# Patient Record
Sex: Male | Born: 1955 | Race: White | Hispanic: No | Marital: Married | Smoking: Former smoker
Health system: Southern US, Community
[De-identification: ages and names within clinical notes are randomized; demographics above are authoritative.]

## PROBLEM LIST (undated history)

## (undated) DIAGNOSIS — S3992XA Unspecified injury of lower back, initial encounter: Secondary | ICD-10-CM

## (undated) DIAGNOSIS — U071 COVID-19: Secondary | ICD-10-CM

## (undated) DIAGNOSIS — K219 Gastro-esophageal reflux disease without esophagitis: Secondary | ICD-10-CM

## (undated) HISTORY — PX: GYNECOMASTIA EXCISION: SHX5170

## (undated) HISTORY — DX: COVID-19: U07.1

## (undated) HISTORY — DX: Unspecified injury of lower back, initial encounter: S39.92XA

## (undated) HISTORY — DX: Gastro-esophageal reflux disease without esophagitis: K21.9

---

## 2013-02-09 ENCOUNTER — Encounter: Payer: Self-pay | Admitting: Adult Health

## 2013-02-09 ENCOUNTER — Ambulatory Visit (INDEPENDENT_AMBULATORY_CARE_PROVIDER_SITE_OTHER): Payer: Self-pay | Admitting: Adult Health

## 2013-02-09 VITALS — BP 124/82 | HR 55 | Temp 98.3°F | Resp 14 | Ht 68.75 in | Wt 181.0 lb

## 2013-02-09 DIAGNOSIS — Z0000 Encounter for general adult medical examination without abnormal findings: Secondary | ICD-10-CM

## 2013-02-09 DIAGNOSIS — K219 Gastro-esophageal reflux disease without esophagitis: Secondary | ICD-10-CM | POA: Insufficient documentation

## 2013-02-09 DIAGNOSIS — Z1211 Encounter for screening for malignant neoplasm of colon: Secondary | ICD-10-CM

## 2013-02-09 DIAGNOSIS — Z23 Encounter for immunization: Secondary | ICD-10-CM

## 2013-02-09 MED ORDER — PANTOPRAZOLE SODIUM 40 MG PO TBEC: 40.0000 mg | DELAYED_RELEASE_TABLET | Freq: Every day | ORAL | Status: DC

## 2013-02-09 NOTE — Progress Notes (Signed)
  Subjective:    Patient ID: Carlos Summers, male    DOB: 04-06-1956, 57 y.o.   MRN: [id number]  HPI  Patient is a pleasant 57 y/o male who presents to clinic today to establish care. He reports that he has not seen a medical provider in approximately 6 years. Denies any significant medical hx other than GERD for which he took a PPI for a short period of time. Pt reports that he still has significant symptoms of acid reflux. Patient currently does not take any medications.  Family Hx: Patient is adopted and has no information about his family hx.  Social Hx: Mr. Gibbon lives at home with his wife. He has 2 adult sons. 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 He exercises regularly with free weights and he cycles for his cardio. He has a  20 yrs <1 ppd remote tobacco hx. Patient reports that he quit approximately 5 years ago.   Health Maintenance:  Needs Tdap - we will give this to him today.  Influenza immunization - received October 2013 at his place of employment  Needs screening colonoscopy - Will refer to GI  We will check routine labs - patient is not fasting today so he will return Monday for fasting labs.    Review of Systems  Constitutional: Negative.   HENT: Negative.   Eyes: Negative.   Respiratory: Negative.   Cardiovascular: Negative.   Gastrointestinal:       Acid reflux  Endocrine: Negative.   Genitourinary: Negative.   Musculoskeletal: Negative.   Skin: Negative.   Allergic/Immunologic: Negative.   Neurological: Negative.   Hematological: Negative.   Psychiatric/Behavioral: Negative.     BP 124/82  Pulse 55  Temp(Src) 98.3 F (36.8 C) (Oral)  Resp 14  Ht 5' 8.75" (1.746 m)  Wt 181 lb (82.101 kg)  BMI 26.93 kg/m2  SpO2 97%     Objective:   Physical Exam  Constitutional: He is oriented to person, place, and time. He appears well-developed and well-nourished. No distress.  HENT:  Head: Normocephalic and atraumatic.  Right Ear: External ear  normal.  Left Ear: External ear normal.  Nose: Nose normal.  Mouth/Throat: Oropharynx is clear and moist.  Eyes: Conjunctivae and EOM are normal. Pupils are equal, round, and reactive to light.  Neck: Normal range of motion. Neck supple. No tracheal deviation present.  Cardiovascular: Normal rate, regular rhythm, normal heart sounds and intact distal pulses.  Exam reveals no gallop and no friction rub.   No murmur heard. Pulmonary/Chest: Effort normal and breath sounds normal. No respiratory distress. He has no wheezes. He has no rales.  Abdominal: Soft. Bowel sounds are normal. He exhibits no mass. There is no tenderness. There is no guarding.  Musculoskeletal: Normal range of motion. He exhibits no edema and no tenderness.  Lymphadenopathy:    He has no cervical adenopathy.  Neurological: He is alert and oriented to person, place, and time. He has normal reflexes. No cranial nerve deficit. He exhibits normal muscle tone. Coordination normal.  Skin: Skin is warm and dry.  Psychiatric: He has a normal mood and affect. His behavior is normal. Judgment and thought content normal.       Assessment & Plan:

## 2013-02-09 NOTE — Assessment & Plan Note (Addendum)
Normal physical exam. Will refer for screening colonoscopy. Check labs: cbc w/diff, cmet, lipids, PSA.  Tdap administered today.

## 2013-02-09 NOTE — Patient Instructions (Addendum)
  Thank you for choosing [facility] for your health care needs.  We will give you your tetanus vaccine today. This will be good for 10 years.  I am referring you to a GI specialist for a screening colonoscopy. Our office will contact you with that information.  Please return for your fasting Labs. Make an appointment just for labs prior to leaving the office today.  Once I receive the results, they will be made available through MyChart. Please remember to activate your account. Below is information on how to do this.  I have sent in a prescription for Protonix to your pharmacy. Take this medication daily - first thing in the morning and 30 min prior to eating or drinking anything.

## 2013-02-09 NOTE — Assessment & Plan Note (Signed)
Symptoms bothersome to patient. Will start protonix daily.

## 2013-02-11 ENCOUNTER — Encounter: Payer: Self-pay | Admitting: Adult Health

## 2013-02-12 ENCOUNTER — Other Ambulatory Visit (INDEPENDENT_AMBULATORY_CARE_PROVIDER_SITE_OTHER): Payer: PRIVATE HEALTH INSURANCE

## 2013-02-12 DIAGNOSIS — Z0000 Encounter for general adult medical examination without abnormal findings: Secondary | ICD-10-CM

## 2013-02-12 LAB — PSA: PSA: 0.15 ng/mL (ref 0.10–4.00)

## 2013-02-12 LAB — CBC WITH DIFFERENTIAL/PLATELET
Basophils Absolute: 0 10*3/uL (ref 0.0–0.1)
Basophils Relative: 0.8 % (ref 0.0–3.0)
Eosinophils Absolute: 0.2 10*3/uL (ref 0.0–0.7)
Lymphocytes Relative: 20.7 % (ref 12.0–46.0)
MCHC: 33.4 g/dL (ref 30.0–36.0)
MCV: 89.8 fl (ref 78.0–100.0)
Monocytes Absolute: 0.8 10*3/uL (ref 0.1–1.0)
Neutro Abs: 4.1 10*3/uL (ref 1.4–7.7)
Neutrophils Relative %: 63.1 % (ref 43.0–77.0)
RDW: 12.7 % (ref 11.5–14.6)

## 2013-02-12 LAB — COMPREHENSIVE METABOLIC PANEL
ALT: 27 U/L (ref 0–53)
AST: 22 U/L (ref 0–37)
Alkaline Phosphatase: 38 U/L — ABNORMAL LOW (ref 39–117)
BUN: 12 mg/dL (ref 6–23)
Calcium: 9.3 mg/dL (ref 8.4–10.5)
Chloride: 102 mEq/L (ref 96–112)
Creatinine, Ser: 0.9 mg/dL (ref 0.4–1.5)
Potassium: 4.5 mEq/L (ref 3.5–5.1)

## 2013-02-12 LAB — LIPID PANEL
HDL: 62.1 mg/dL (ref >39.00)
LDL Cholesterol: 100 mg/dL — ABNORMAL HIGH (ref 0–99)
Total CHOL/HDL Ratio: 3
Triglycerides: 63 mg/dL (ref 0.0–149.0)

## 2013-06-05 ENCOUNTER — Encounter: Payer: Self-pay | Admitting: Adult Health

## 2013-06-06 MED ORDER — PANTOPRAZOLE SODIUM 40 MG PO TBEC: 40.0000 mg | DELAYED_RELEASE_TABLET | Freq: Every day | ORAL | Status: DC

## 2013-10-25 ENCOUNTER — Other Ambulatory Visit: Payer: Self-pay

## 2014-06-28 ENCOUNTER — Telehealth: Payer: Self-pay

## 2014-06-28 ENCOUNTER — Other Ambulatory Visit: Payer: Self-pay

## 2014-06-28 MED ORDER — PANTOPRAZOLE SODIUM 40 MG PO TBEC: 40.0000 mg | DELAYED_RELEASE_TABLET | Freq: Every day | ORAL | Status: DC

## 2014-06-28 NOTE — Telephone Encounter (Signed)
Received a request to refill pantoprazole sodium 40mg  tab from pharmacy. Patient last office was 02/09/13. Is it ok to refill?

## 2014-06-28 NOTE — Telephone Encounter (Signed)
yes

## 2014-06-28 NOTE — Telephone Encounter (Signed)
Refill sent to pharmacy.   

## 2015-12-21 HISTORY — PX: BACK SURGERY: SHX140

## 2017-03-30 ENCOUNTER — Encounter: Payer: Self-pay | Admitting: Family Medicine

## 2017-03-30 ENCOUNTER — Ambulatory Visit (INDEPENDENT_AMBULATORY_CARE_PROVIDER_SITE_OTHER): Payer: PRIVATE HEALTH INSURANCE | Admitting: Family Medicine

## 2017-03-30 VITALS — BP 126/82 | HR 67 | Temp 98.2°F | Wt 190.5 lb

## 2017-03-30 DIAGNOSIS — W57XXXA Bitten or stung by nonvenomous insect and other nonvenomous arthropods, initial encounter: Secondary | ICD-10-CM | POA: Insufficient documentation

## 2017-03-30 DIAGNOSIS — Z130 Encounter for screening for diseases of the blood and blood-forming organs and certain disorders involving the immune mechanism: Secondary | ICD-10-CM

## 2017-03-30 DIAGNOSIS — E663 Overweight: Secondary | ICD-10-CM | POA: Diagnosis not present

## 2017-03-30 LAB — COMPREHENSIVE METABOLIC PANEL
ALT: 19 U/L (ref 0–53)
AST: 17 U/L (ref 0–37)
Albumin: 4.6 g/dL (ref 3.5–5.2)
Alkaline Phosphatase: 46 U/L (ref 39–117)
BILIRUBIN TOTAL: 0.7 mg/dL (ref 0.2–1.2)
BUN: 13 mg/dL (ref 6–23)
CHLORIDE: 102 meq/L (ref 96–112)
CO2: 31 meq/L (ref 19–32)
CREATININE: 0.9 mg/dL (ref 0.40–1.50)
Calcium: 9.5 mg/dL (ref 8.4–10.5)
GFR: 91.94 mL/min (ref >60.00)
GLUCOSE: 88 mg/dL (ref 70–99)
Potassium: 4.4 mEq/L (ref 3.5–5.1)
SODIUM: 139 meq/L (ref 135–145)
Total Protein: 6.9 g/dL (ref 6.0–8.3)

## 2017-03-30 LAB — LIPID PANEL
CHOL/HDL RATIO: 4
Cholesterol: 213 mg/dL — ABNORMAL HIGH (ref 0–200)
HDL: 56.5 mg/dL (ref >39.00)
LDL Cholesterol: 135 mg/dL — ABNORMAL HIGH (ref 0–99)
NONHDL: 156.3
TRIGLYCERIDES: 105 mg/dL (ref 0.0–149.0)
VLDL: 21 mg/dL (ref 0.0–40.0)

## 2017-03-30 LAB — CBC
HCT: 40.9 % (ref 39.0–52.0)
HEMOGLOBIN: 13.7 g/dL (ref 13.0–17.0)
MCHC: 33.6 g/dL (ref 30.0–36.0)
MCV: 87.5 fl (ref 78.0–100.0)
Platelets: 226 10*3/uL (ref 150.0–400.0)
RBC: 4.67 Mil/uL (ref 4.22–5.81)
RDW: 13.3 % (ref 11.5–15.5)
WBC: 5.8 10*3/uL (ref 4.0–10.5)

## 2017-03-30 LAB — HEMOGLOBIN A1C: HEMOGLOBIN A1C: 5.7 % (ref 4.6–6.5)

## 2017-03-30 NOTE — Assessment & Plan Note (Signed)
New problem. Unremarkable exam. History unremarkable as well. Patient very concerned. Patient desired testing regarding Lyme disease. Labs today.

## 2017-03-30 NOTE — Progress Notes (Signed)
   Subjective:  Patient ID: Carlos Summers, male    DOB: 1956-04-01  Age: 61 y.o. MRN: [id number]  CC: Tick bite  HPI [name] is a 61 y.o. male presents to the clinic today with complaints of a recent tick bite.  Patient states in the middle of March she was bitten by a tick. He states it was on for approximately 36 hours and then was removed. He was bitten on his right lower extremity.  Patient has a friend who has had Lyme disease. He has no current fevers, chills, or other systemic signs or symptoms. He had no discrete rash following the tick bite. He states that he's had some superficial skin discomfort over his trunk and abdomen recently but no rash noted. He has no other associated symptoms. No other complaints or concerns at this time.   PMH, Surgical Hx, Family Hx, Social History reviewed and updated as below.  Past Medical History:  Diagnosis Date  . GERD (gastroesophageal reflux disease)   . Lower back injury    Ruptured L5   Past Surgical History:  Procedure Laterality Date  . BACK SURGERY  2017   fusion of L2-4   Family History  Problem Relation Age of Onset  . Adopted: Yes   Social History  Substance Use Topics  . Smoking status: Former Smoker    Quit date: 02/10/2008  . Smokeless tobacco: Never Used  . Alcohol use 4.8 oz/week    2 Glasses of wine, 6 Cans of beer per week   Review of Systems  Skin:       Tick bite.   All other systems reviewed and are negative.  Objective:   Today's Vitals: BP 126/82   Pulse 67   Temp 98.2 F (36.8 C) (Oral)   Wt 190 lb 8 oz (86.4 kg)   SpO2 91%   BMI 28.34 kg/m   Physical Exam  Constitutional: He is oriented to person, place, and time. He appears well-developed. No distress.  HENT:  Head: Normocephalic and atraumatic.  Mouth/Throat: Oropharynx is clear and moist.  Eyes: Conjunctivae are normal.  Neck: Normal range of motion.  Cardiovascular: Normal rate and regular rhythm.   Pulmonary/Chest: Effort  normal and breath sounds normal.  Abdominal: Soft. He exhibits no distension. There is no tenderness.  Musculoskeletal: Normal range of motion.  Neurological: He is alert and oriented to person, place, and time.  Skin: No rash noted.  Psychiatric: He has a normal mood and affect.  Vitals reviewed.  Assessment & Plan:   Problem List Items Addressed This Visit    Tick bite - Primary    New problem. Unremarkable exam. History unremarkable as well. Patient very concerned. Patient desired testing regarding Lyme disease. Labs today.      Relevant Orders   Lyme Ab/Western Blot Reflex    Other Visit Diagnoses    Screening for deficiency anemia       Relevant Orders   CBC   Overweight (BMI 25.0-29.9)       Relevant Orders   Hemoglobin A1c   Comprehensive metabolic panel   Lipid panel     Follow-up: Has physical in May  Everlene Other DO [facility]

## 2017-03-30 NOTE — Patient Instructions (Signed)
We will call with your lab results.  Follow up in may.  Take care  Dr. Adriana Simas

## 2017-03-30 NOTE — Progress Notes (Signed)
Pre visit review using our clinic review tool, if applicable. No additional management support is needed unless otherwise documented below in the visit note. 

## 2017-03-31 LAB — LYME AB/WESTERN BLOT REFLEX: B burgdorferi Ab IgG+IgM: <0.9 Index (ref <0.90)

## 2017-04-21 ENCOUNTER — Ambulatory Visit (INDEPENDENT_AMBULATORY_CARE_PROVIDER_SITE_OTHER): Payer: PRIVATE HEALTH INSURANCE | Admitting: Family Medicine

## 2017-04-21 ENCOUNTER — Encounter: Payer: Self-pay | Admitting: Family Medicine

## 2017-04-21 ENCOUNTER — Ambulatory Visit: Payer: PRIVATE HEALTH INSURANCE | Admitting: Family Medicine

## 2017-04-21 DIAGNOSIS — Z0000 Encounter for general adult medical examination without abnormal findings: Secondary | ICD-10-CM | POA: Diagnosis not present

## 2017-04-21 DIAGNOSIS — E785 Hyperlipidemia, unspecified: Secondary | ICD-10-CM | POA: Insufficient documentation

## 2017-04-21 DIAGNOSIS — R7303 Prediabetes: Secondary | ICD-10-CM | POA: Insufficient documentation

## 2017-04-21 NOTE — Patient Instructions (Signed)
 Health Maintenance, Male A healthy lifestyle and preventive care is important for your health and wellness. Ask your health care provider about what schedule of regular examinations is right for you. What should I know about weight and diet?  Eat a Healthy Diet  Eat plenty of vegetables, fruits, whole grains, low-fat dairy products, and lean protein.  Do not eat a lot of foods high in solid fats, added sugars, or salt. Maintain a Healthy Weight  Regular exercise can help you achieve or maintain a healthy weight. You should:  Do at least 150 minutes of exercise each week. The exercise should increase your heart rate and make you sweat (moderate-intensity exercise).  Do strength-training exercises at least twice a week. Watch Your Levels of Cholesterol and Blood Lipids  Have your blood tested for lipids and cholesterol every 5 years starting at 59 years of age. If you are at high risk for heart disease, you should start having your blood tested when you are 59 years old. You may need to have your cholesterol levels checked more often if:  Your lipid or cholesterol levels are high.  You are older than 59 years of age.  You are at high risk for heart disease. What should I know about cancer screening? Many types of cancers can be detected early and may often be prevented. Lung Cancer  You should be screened every year for lung cancer if:  You are a current smoker who has smoked for at least 30 years.  You are a former smoker who has quit within the past 15 years.  Talk to your health care provider about your screening options, when you should start screening, and how often you should be screened. Colorectal Cancer  Routine colorectal cancer screening usually begins at 59 years of age and should be repeated every 5-10 years until you are 59 years old. You may need to be screened more often if early forms of precancerous polyps or small growths are found. Your health care provider  may recommend screening at an earlier age if you have risk factors for colon cancer.  Your health care provider may recommend using home test kits to check for hidden blood in the stool.  A small camera at the end of a tube can be used to examine your colon (sigmoidoscopy or colonoscopy). This checks for the earliest forms of colorectal cancer. Prostate and Testicular Cancer  Depending on your age and overall health, your health care provider may do certain tests to screen for prostate and testicular cancer.  Talk to your health care provider about any symptoms or concerns you have about testicular or prostate cancer. Skin Cancer  Check your skin from head to toe regularly.  Tell your health care provider about any new moles or changes in moles, especially if:  There is a change in a mole's size, shape, or color.  You have a mole that is larger than a pencil eraser.  Always use sunscreen. Apply sunscreen liberally and repeat throughout the day.  Protect yourself by wearing long sleeves, pants, a wide-brimmed hat, and sunglasses when outside. What should I know about heart disease, diabetes, and high blood pressure?  If you are 18-39 years of age, have your blood pressure checked every 3-5 years. If you are 40 years of age or older, have your blood pressure checked every year. You should have your blood pressure measured twice-once when you are at a hospital or clinic, and once when you are not at   a hospital or clinic. Record the average of the two measurements. To check your blood pressure when you are not at a hospital or clinic, you can use:  An automated blood pressure machine at a pharmacy.  A home blood pressure monitor.  Talk to your health care provider about your target blood pressure.  If you are between 45-79 years old, ask your health care provider if you should take aspirin to prevent heart disease.  Have regular diabetes screenings by checking your fasting blood sugar  level.  If you are at a normal weight and have a low risk for diabetes, have this test once every three years after the age of 45.  If you are overweight and have a high risk for diabetes, consider being tested at a younger age or more often.  A one-time screening for abdominal aortic aneurysm (AAA) by ultrasound is recommended for men aged 65-75 years who are current or former smokers. What should I know about preventing infection? Hepatitis B  If you have a higher risk for hepatitis B, you should be screened for this virus. Talk with your health care provider to find out if you are at risk for hepatitis B infection. Hepatitis C  Blood testing is recommended for:  Everyone born from 1945 through 1965.  Anyone with known risk factors for hepatitis C. Sexually Transmitted Diseases (STDs)  You should be screened each year for STDs including gonorrhea and chlamydia if:  You are sexually active and are younger than 59 years of age.  You are older than 59 years of age and your health care provider tells you that you are at risk for this type of infection.  Your sexual activity has changed since you were last screened and you are at an increased risk for chlamydia or gonorrhea. Ask your health care provider if you are at risk.  Talk with your health care provider about whether you are at high risk of being infected with HIV. Your health care provider may recommend a prescription medicine to help prevent HIV infection. What else can I do?  Schedule regular health, dental, and eye exams.  Stay current with your vaccines (immunizations).  Do not use any tobacco products, such as cigarettes, chewing tobacco, and e-cigarettes. If you need help quitting, ask your health care provider.  Limit alcohol intake to no more than 2 drinks per day. One drink equals 12 ounces of beer, 5 ounces of wine, or 1 ounces of hard liquor.  Do not use street drugs.  Do not share needles.  Ask your health  care provider for help if you need support or information about quitting drugs.  Tell your health care provider if you often feel depressed.  Tell your health care provider if you have ever been abused or do not feel safe at home. This information is not intended to replace advice given to you by your health care provider. Make sure you discuss any questions you have with your health care provider. Document Released: 06/03/2008 Document Revised: 08/04/2016 Document Reviewed: 09/09/2015 Elsevier Interactive Patient Education  2017 Elsevier Inc.  

## 2017-04-21 NOTE — Progress Notes (Signed)
Pre visit review using our clinic review tool, if applicable. No additional management support is needed unless otherwise documented below in the visit note. 

## 2017-04-21 NOTE — Assessment & Plan Note (Signed)
Colonoscopy up-to-date. Tetanus and flu up-to-date. Declined hep C and HIV screening.

## 2017-04-21 NOTE — Progress Notes (Signed)
   Subjective:  Patient ID: Carlos CriglerMichael A Ricchio, male    DOB: 08-02-1956  Age: 61 y.o. MRN: [id number]  CC: Annual physical  HPI Carlos Summers is a 61 y.o. male presents to the clinic today for an annual physical exam.   Preventative Healthcare  Colonoscopy: Up to date.  Immunizations  Tetanus - Up to date.   Pneumococcal - Not indicated.   Flu - Up to date.   Hepatitis C screening - Declines.  Labs: Has had recent labs.   Alcohol use: See below.  Smoking/tobacco use: Former.   STD/HIV testing: Declines.  PMH, Surgical Hx, Family Hx, Social History reviewed and updated as below.  Past Medical History:  Diagnosis Date  . GERD (gastroesophageal reflux disease)   . Lower back injury    Ruptured L5   Past Surgical History:  Procedure Laterality Date  . BACK SURGERY  2017   fusion of L2-4   Family History  Problem Relation Age of Onset  . Adopted: Yes   Social History  Substance Use Topics  . Smoking status: Former Smoker    Quit date: 02/10/2008  . Smokeless tobacco: Never Used  . Alcohol use 4.8 oz/week    2 Glasses of wine, 6 Cans of beer per week   Review of Systems General: Denies unexplained weight loss, fever. Skin: Denies new or changing mole, sore/wound that won't heal. ENT: Trouble hearing, ringing in the ears, sores in the mouth, hoarseness, trouble swallowing. Eyes: Denies trouble seeing/visual disturbance. Heart/CV: Denies chest pain, shortness of breath, edema, palpitations. Lungs/Resp: Denies cough, shortness of breath, hemoptysis. Abd/GI: Denies nausea, vomiting, diarrhea, constipation, abdominal pain, hematochezia, melena. GU: Denies dysuria, incontinence, hematuria, urinary frequency, difficulty starting/keeping stream, penile discharge, sexual difficulty, lump in testicles. MSK: Denies joint pain/swelling, myalgias. Neuro: Denies headaches, weakness, numbness, dizziness, syncope. Psych: Denies sadness, anxiety, stress, memory  difficulty. Endocrine: Denies polyuria and polydipsia.  Objective:   Today's Vitals: BP 130/90   Pulse 63   Temp 98.4 F (36.9 C) (Oral)   Wt 189 lb 2 oz (85.8 kg)   SpO2 96%   BMI 28.13 kg/m   Physical Exam  Constitutional: He is oriented to person, place, and time. He appears well-developed and well-nourished. No distress.  HENT:  Head: Normocephalic and atraumatic.  Nose: Nose normal.  Mouth/Throat: Oropharynx is clear and moist. No oropharyngeal exudate.  Normal TM's bilaterally.   Eyes: Conjunctivae are normal. No scleral icterus.  Neck: Neck supple.  Cardiovascular: Normal rate and regular rhythm.   No murmur heard. Pulmonary/Chest: Effort normal and breath sounds normal. He has no wheezes. He has no rales.  Abdominal: Soft. He exhibits no distension. There is no tenderness. There is no rebound and no guarding.  Musculoskeletal: Normal range of motion. He exhibits no edema.  Lymphadenopathy:    He has no cervical adenopathy.  Neurological: He is alert and oriented to person, place, and time.  Skin: Skin is warm and dry. No rash noted.  Psychiatric: He has a normal mood and affect.  Vitals reviewed.  Assessment & Plan:   Problem List Items Addressed This Visit    Annual physical exam    Colonoscopy up-to-date. Tetanus and flu up-to-date. Declined hep C and HIV screening.         Follow-up: Return in about 1 year (around 04/21/2018).  Everlene OtherJayce Notnamed Scholz DO [facility] [place]

## 2018-01-06 ENCOUNTER — Encounter: Payer: Self-pay | Admitting: Internal Medicine

## 2018-01-06 ENCOUNTER — Ambulatory Visit: Payer: PRIVATE HEALTH INSURANCE | Admitting: Internal Medicine

## 2018-01-06 VITALS — BP 140/82 | HR 65 | Temp 99.1°F | Ht 70.0 in | Wt 196.5 lb

## 2018-01-06 DIAGNOSIS — J069 Acute upper respiratory infection, unspecified: Secondary | ICD-10-CM | POA: Diagnosis not present

## 2018-01-06 MED ORDER — AZITHROMYCIN 250 MG PO TABS: ORAL_TABLET | ORAL | 0 refills | Status: DC

## 2018-01-06 NOTE — Progress Notes (Signed)
Chief Complaint  Patient presents with  . Cough   C/o chest congestion and dry cough since before Xmas no fever, chills, body aches, sore throat slight runny nose no sneezing. 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 Sick contacts at work. Tried Mucinex OTC. Former light smoker quit 2011/2012.  Had flu shot 08/2017    Review of Systems  Constitutional: Negative for fever.  HENT: Negative for sore throat.        +postnasal drip  +runny nose  +hoarseness   Respiratory: Positive for cough. Negative for shortness of breath and wheezing.   Cardiovascular: Negative for chest pain.  Skin: Negative for rash.   Past Medical History:  Diagnosis Date  . GERD (gastroesophageal reflux disease)   . Lower back injury    Ruptured L5   Past Surgical History:  Procedure Laterality Date  . BACK SURGERY  2017   fusion of L2-4   Family History  Adopted: Yes   Social History   Socioeconomic History  . Marital status: Married    Spouse name: Not on file  . Number of children: Not on file  . Years of education: Not on file  . Highest education level: Not on file  Social Needs  . Financial resource strain: Not on file  . Food insecurity - worry: Not on file  . Food insecurity - inability: Not on file  . Transportation needs - medical: Not on file  . Transportation needs - non-medical: Not on file  Occupational History  . Occupation: MudloggerMachinest    Employer: [employer]  Tobacco Use  . Smoking status: Former Smoker    Last attempt to quit: 02/10/2008    Years since quitting: 9.9  . Smokeless tobacco: Never Used  . Tobacco comment: quit 2011/2012 light smoker  Substance and Sexual Activity  . Alcohol use: Yes    Alcohol/week: 4.8 oz    Types: 2 Glasses of wine, 6 Cans of beer per week  . Drug use: No  . Sexual activity: Yes    Partners: Female  Other Topics Concern  . Not on file  Social History Narrative   Musician    No outpatient medications have been  marked as taking for the 01/06/18 encounter (Office Visit) with McLean-Scocuzza, Pasty Spillersracy N, MD.   No Known Allergies No results found for this or any previous visit (from the past 2160 hour(s)). Objective  Body mass index is 28.19 kg/m. Wt Readings from Last 3 Encounters:  01/06/18 196 lb 8 oz (89.1 kg)  04/21/17 189 lb 2 oz (85.8 kg)  03/30/17 190 lb 8 oz (86.4 kg)   Temp Readings from Last 3 Encounters:  01/06/18 99.1 F (37.3 C) (Oral)  04/21/17 98.4 F (36.9 C) (Oral)  03/30/17 98.2 F (36.8 C) (Oral)   BP Readings from Last 3 Encounters:  01/06/18 140/82  04/21/17 130/90  03/30/17 126/82   Pulse Readings from Last 3 Encounters:  01/06/18 65  04/21/17 63  03/30/17 67   O2 sat room air 95%  Physical Exam  Constitutional: He is oriented to person, place, and time and well-developed, well-nourished, and in no distress.  HENT:  Head: Normocephalic and atraumatic.  Eyes: Conjunctivae are normal. Pupils are equal, round, and reactive to light.  Cardiovascular: Normal rate, regular rhythm and normal heart sounds.  Pulmonary/Chest: Effort normal and breath sounds normal.  Neurological: He is alert and oriented to person, place, and time. Gait normal.  Skin: Skin is warm  and dry.  Psychiatric: Mood, memory, affect and judgment normal.  Nursing note and vitals reviewed.   Assessment   1. URI could be viral vs bacterial x 3 weeks  Plan  1. Zpack  Call in 2 weeks if not better will do cXR  Congratulated on quiting smoking 7-8 years ago  Supportive care otherwise Had flu shot 08/2017  Provider: Dr. French Ana McLean-Scocuzza-[facility]

## 2018-01-06 NOTE — Patient Instructions (Addendum)
Call back in 2 weeks if not better  Otherwise physical middle  03/2018  Upper Respiratory Infection, Adult Most upper respiratory infections (URIs) are caused by a virus. A URI affects the nose, throat, and upper air passages. The most common type of URI is often called "the common cold." Follow these instructions at home:  Take medicines only as told by your doctor.  Gargle warm saltwater or take cough drops to comfort your throat as told by your doctor.  Use a warm mist humidifier or inhale steam from a shower to increase air moisture. This may make it easier to breathe.  Drink enough fluid to keep your pee (urine) clear or pale yellow.  Eat soups and other clear broths.  Have a healthy diet.  Rest as needed.  Go back to work when your fever is gone or your doctor says it is okay. ? You may need to stay home longer to avoid giving your URI to others. ? You can also wear a face mask and wash your hands often to prevent spread of the virus.  Use your inhaler more if you have asthma.  Do not use any tobacco products, including cigarettes, chewing tobacco, or electronic cigarettes. If you need help quitting, ask your doctor. Contact a doctor if:  You are getting worse, not better.  Your symptoms are not helped by medicine.  You have chills.  You are getting more short of breath.  You have brown or red mucus.  You have yellow or brown discharge from your nose.  You have pain in your face, especially when you bend forward.  You have a fever.  You have puffy (swollen) neck glands.  You have pain while swallowing.  You have white areas in the back of your throat. Get help right away if:  You have very bad or constant: ? Headache. ? Ear pain. ? Pain in your forehead, behind your eyes, and over your cheekbones (sinus pain). ? Chest pain.  You have long-lasting (chronic) lung disease and any of the following: ? Wheezing. ? Long-lasting cough. ? Coughing up  blood. ? A change in your usual mucus.  You have a stiff neck.  You have changes in your: ? Vision. ? Hearing. ? Thinking. ? Mood. This information is not intended to replace advice given to you by your health care provider. Make sure you discuss any questions you have with your health care provider. Document Released: 05/24/2008 Document Revised: 08/08/2016 Document Reviewed: 03/13/2014 Elsevier Interactive Patient Education  2018 [employer]

## 2018-01-19 ENCOUNTER — Telehealth: Payer: Self-pay

## 2018-01-19 NOTE — Telephone Encounter (Signed)
Copied from CRM [id number]. Topic: Appointment Scheduling - Scheduling Inquiry for Clinic >> Jan 19, 2018  3:52 PM Eston Mouldavis, Cheri B wrote: Reason for CRM: PT called to schedule an apt with Dr Shirlee LatchMcLean  because he saw her two weeks ago for a cold and was told to come back in 2 weeks if he was not better.  When looking at schedule I did not see an available apt slot for tomorrow. I told pt I would send over a note to the office to see if he can be worked in [employer] account executivetomorrow-

## 2018-01-20 NOTE — Telephone Encounter (Signed)
Patient will have to be seen by another provider.

## 2018-01-20 NOTE — Telephone Encounter (Signed)
Spoke to patient feels like he has chest cold.  Still coughing in the am, productive cough green to yellowish in color ,   dry cough in pm , Reports he is  Better than when  he was seen.  Still taking Mucinex DM  in the am, drinking plenty of water. No fever or chills.   No shortness of breath.  Scheduled for appointment on Monday

## 2018-01-23 ENCOUNTER — Ambulatory Visit (INDEPENDENT_AMBULATORY_CARE_PROVIDER_SITE_OTHER): Payer: PRIVATE HEALTH INSURANCE

## 2018-01-23 ENCOUNTER — Ambulatory Visit: Payer: PRIVATE HEALTH INSURANCE | Admitting: Internal Medicine

## 2018-01-23 ENCOUNTER — Encounter: Payer: Self-pay | Admitting: Internal Medicine

## 2018-01-23 VITALS — BP 124/68 | HR 65 | Temp 97.9°F | Ht 70.0 in | Wt 199.6 lb

## 2018-01-23 DIAGNOSIS — J309 Allergic rhinitis, unspecified: Secondary | ICD-10-CM

## 2018-01-23 DIAGNOSIS — Z125 Encounter for screening for malignant neoplasm of prostate: Secondary | ICD-10-CM

## 2018-01-23 DIAGNOSIS — E785 Hyperlipidemia, unspecified: Secondary | ICD-10-CM

## 2018-01-23 DIAGNOSIS — Z13818 Encounter for screening for other digestive system disorders: Secondary | ICD-10-CM | POA: Diagnosis not present

## 2018-01-23 DIAGNOSIS — K219 Gastro-esophageal reflux disease without esophagitis: Secondary | ICD-10-CM | POA: Diagnosis not present

## 2018-01-23 DIAGNOSIS — R7303 Prediabetes: Secondary | ICD-10-CM

## 2018-01-23 DIAGNOSIS — R05 Cough: Secondary | ICD-10-CM | POA: Diagnosis not present

## 2018-01-23 DIAGNOSIS — Z1159 Encounter for screening for other viral diseases: Secondary | ICD-10-CM

## 2018-01-23 DIAGNOSIS — R490 Dysphonia: Secondary | ICD-10-CM

## 2018-01-23 DIAGNOSIS — Z1329 Encounter for screening for other suspected endocrine disorder: Secondary | ICD-10-CM

## 2018-01-23 DIAGNOSIS — R059 Cough, unspecified: Secondary | ICD-10-CM | POA: Insufficient documentation

## 2018-01-23 MED ORDER — PANTOPRAZOLE SODIUM 40 MG PO TBEC: 40.0000 mg | DELAYED_RELEASE_TABLET | ORAL | 0 refills | Status: DC

## 2018-01-23 MED ORDER — FLUTICASONE PROPIONATE 50 MCG/ACT NA SUSP: 1.0000 | Freq: Every day | NASAL | 5 refills | Status: DC

## 2018-01-23 NOTE — Progress Notes (Signed)
Pre visit review using our clinic review tool, if applicable. No additional management support is needed unless otherwise documented below in the visit note. 

## 2018-01-23 NOTE — Progress Notes (Signed)
Chief Complaint  Patient presents with  . Follow-up   F/u  Still c/o cough and intermittent hoarseness tried Mucinex but still coughing in am sometimes yellow/green phlegm but it is less since he took Zpack, No fever or chills. He does report postnasal drip and GERD. He used to be on Protonix 40 mg and wants to see if this will help with sx's b/c he does have flares of GERD at times with certain foods I.e spicy foods.  He denies h/o asthma or allergies but does work in Insurance claims handlermachine shop with irritants.    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    Review of Systems  Constitutional: Negative for chills and fever.  HENT:       +hoarseness  +post nasal drip  Respiratory: Positive for cough.   Cardiovascular: Negative for chest pain.  Gastrointestinal: Positive for heartburn.  Skin: Negative for rash.   Past Medical History:  Diagnosis Date  . GERD (gastroesophageal reflux disease)   . Lower back injury    Ruptured L5   Past Surgical History:  Procedure Laterality Date  . BACK SURGERY  2017   fusion of L2-4   Family History  Adopted: Yes   Social History   Socioeconomic History  . Marital status: Married    Spouse name: Not on file  . Number of children: Not on file  . Years of education: Not on file  . Highest education level: Not on file  Social Needs  . Financial resource strain: Not on file  . Food insecurity - worry: Not on file  . Food insecurity - inability: Not on file  . Transportation needs - medical: Not on file  . Transportation needs - non-medical: Not on file  Occupational History  . Occupation: MudloggerMachinest    Employer: [employer]  Tobacco Use  . Smoking status: Former Smoker    Last attempt to quit: 02/10/2008    Years since quitting: 9.9  . Smokeless tobacco: Never Used  . Tobacco comment: quit 2011/2012 light smoker  Substance and Sexual Activity  . Alcohol use: Yes   Alcohol/week: 4.8 oz    Types: 2 Glasses of wine, 6 Cans of beer per week  . Drug use: No  . Sexual activity: Yes    Partners: Female  Other Topics Concern  . Not on file  Social History Narrative   Musician    Current Meds  Medication Sig  . guaiFENesin (MUCINEX) 600 MG 12 hr tablet Take by mouth 2 (two) times daily.   Allergies  Allergen Reactions  . Other    No results found for this or any previous visit (from the past 2160 hour(s)). Objective  Body mass index is 28.64 kg/m. Wt Readings from Last 3 Encounters:  01/23/18 199 lb 9.6 oz (90.5 kg)  01/06/18 196 lb 8 oz (89.1 kg)  04/21/17 189 lb 2 oz (85.8 kg)   Temp Readings from Last 3 Encounters:  01/23/18 97.9 F (36.6 C) (Oral)  01/06/18 99.1 F (37.3 C) (Oral)  04/21/17 98.4 F (36.9 C) (Oral)   BP Readings from Last 3 Encounters:  01/23/18 124/68  01/06/18 140/82  04/21/17 130/90   Pulse Readings from Last 3 Encounters:  01/23/18 65  01/06/18 65  04/21/17 63   O2 sat room air 98% Physical Exam  Constitutional: He is oriented to person, place, and time and well-developed, well-nourished, and  in no distress.  HENT:  Head: Normocephalic and atraumatic.  Eyes: Pupils are equal, round, and reactive to light.  Cardiovascular: Normal rate, regular rhythm and normal heart sounds.  Pulmonary/Chest: Effort normal and breath sounds normal.  Neurological: He is alert and oriented to person, place, and time. Gait normal.  Skin: Skin is warm and dry.  Psychiatric: Mood, memory, affect and judgment normal.  Nursing note and vitals reviewed.   Assessment   1. Cough ? Etiology allergic vs GERD vs post viral vs post nasal drip  2. Hoarseness  3.HM Plan  1.  Trial of protonix 40 mg Trial of flonase could consider OTC zyrtec/claritin  CXR today r/o pneumonia vs other  Given info on GERD 2. Consider ENT if not improved at f/u  tx GERD and postnasal drip to see if improved sx's  3.  Had flu shot 08/2017  Tdap  up to date   Check labs CMET, CBC, lipid, TSH, T4, A1C, UA, PSA, hep B/C at f/u  Former smoker x 20 years total started as a kid unknown FH adopted.   Need to do DRE in future    Provider: Dr. French Ana McLean-Scocuzza-Internal Medicine

## 2018-01-23 NOTE — Patient Instructions (Addendum)
Try Protonix  Try Flonase  Labs schedule 03/30/18 and f/u with me 03/31/18  Gastroesophageal Reflux Disease, Adult Normally, food travels down the esophagus and stays in the stomach to be digested. If a person has gastroesophageal reflux disease (GERD), food and stomach acid move back up into the esophagus. When this happens, the esophagus becomes sore and swollen (inflamed). Over time, GERD can make small holes (ulcers) in the lining of the esophagus. Follow these instructions at home: Diet  Follow a diet as told by your doctor. You may need to avoid foods and drinks such as: ? Coffee and tea (with or without caffeine). ? Drinks that contain alcohol. ? Energy drinks and sports drinks. ? Carbonated drinks or sodas. ? Chocolate and cocoa. ? Peppermint and mint flavorings. ? Garlic and onions. ? Horseradish. ? Spicy and acidic foods, such as peppers, chili powder, curry powder, vinegar, hot sauces, and BBQ sauce. ? Citrus fruit juices and citrus fruits, such as oranges, lemons, and limes. ? Tomato-based foods, such as red sauce, chili, salsa, and pizza with red sauce. ? Fried and fatty foods, such as donuts, french fries, potato chips, and high-fat dressings. ? High-fat meats, such as hot dogs, rib eye steak, sausage, ham, and bacon. ? High-fat dairy items, such as whole milk, butter, and cream cheese.  Eat small meals often. Avoid eating large meals.  Avoid drinking large amounts of liquid with your meals.  Avoid eating meals during the 2-3 hours before bedtime.  Avoid lying down right after you eat.  Do not exercise right after you eat. General instructions  Pay attention to any changes in your symptoms.  Take over-the-counter and prescription medicines only as told by your doctor. Do not take aspirin, ibuprofen, or other NSAIDs unless your doctor says it is okay.  Do not use any tobacco products, including cigarettes, chewing tobacco, and e-cigarettes. If you need help  quitting, ask your doctor.  Wear loose clothes. Do not wear anything tight around your waist.  Raise (elevate) the head of your bed about 6 inches (15 cm).  Try to lower your stress. If you need help doing this, ask your doctor.  If you are overweight, lose an amount of weight that is healthy for you. Ask your doctor about a safe weight loss goal.  Keep all follow-up visits as told by your doctor. This is important. Contact a doctor if:  You have new symptoms.  You lose weight and you do not know why it is happening.  You have trouble swallowing, or it hurts to swallow.  You have wheezing or a cough that keeps happening.  Your symptoms do not get better with treatment.  You have a hoarse voice. Get help right away if:  You have pain in your arms, neck, jaw, teeth, or back.  You feel sweaty, dizzy, or light-headed.  You have chest pain or shortness of breath.  You throw up (vomit) and your throw up looks like blood or coffee grounds.  You pass out (faint).  Your poop (stool) is bloody or black.  You cannot swallow, drink, or eat. This information is not intended to replace advice given to you by your health care provider. Make sure you discuss any questions you have with your health care provider. Document Released: 05/24/2008 Document Revised: 05/13/2016 Document Reviewed: 04/02/2015 Elsevier Interactive Patient Education  2018 [employer]    Food Choices for Gastroesophageal Reflux Disease, Adult When you have gastroesophageal reflux disease (GERD), the foods you eat and  your eating habits are very important. Choosing the right foods can help ease your discomfort. What guidelines do I need to follow?  Choose fruits, vegetables, whole grains, and low-fat dairy products.  Choose low-fat meat, fish, and poultry.  Limit fats such as oils, salad dressings, butter, nuts, and avocado.  Keep a food diary. This helps you identify foods that cause symptoms.  Avoid  foods that cause symptoms. These may be different for everyone.  Eat small meals often instead of 3 large meals a day.  Eat your meals slowly, in a place where you are relaxed.  Limit fried foods.  Cook foods using methods other than frying.  Avoid drinking alcohol.  Avoid drinking large amounts of liquids with your meals.  Avoid bending over or lying down until 2-3 hours after eating. What foods are not recommended? These are some foods and drinks that may make your symptoms worse: Vegetables Tomatoes. Tomato juice. Tomato and spaghetti sauce. Chili peppers. Onion and garlic. Horseradish. Fruits Oranges, grapefruit, and lemon (fruit and juice). Meats High-fat meats, fish, and poultry. This includes hot dogs, ribs, ham, sausage, salami, and bacon. Dairy Whole milk and chocolate milk. Sour cream. Cream. Butter. Ice cream. Cream cheese. Drinks Coffee and tea. Bubbly (carbonated) drinks or energy drinks. Condiments Hot sauce. Barbecue sauce. Sweets/Desserts Chocolate and cocoa. Donuts. Peppermint and spearmint. Fats and Oils High-fat foods. This includes [place] fries and potato chips. Other Vinegar. Strong spices. This includes black pepper, white pepper, red pepper, cayenne, curry powder, cloves, ginger, and chili powder. The items listed above may not be a complete list of foods and drinks to avoid. Contact your dietitian for more information. This information is not intended to replace advice given to you by your health care provider. Make sure you discuss any questions you have with your health care provider. Document Released: 06/06/2012 Document Revised: 05/13/2016 Document Reviewed: 10/10/2013 Elsevier Interactive Patient Education  2017 Elsevier Inc.   Cough, Adult Coughing is a reflex that clears your throat and your airways. Coughing helps to heal and protect your lungs. It is normal to cough occasionally, but a cough that happens with other symptoms or lasts a long  time may be a sign of a condition that needs treatment. A cough may last only 2-3 weeks (acute), or it may last longer than 8 weeks (chronic). What are the causes? Coughing is commonly caused by:  Breathing in substances that irritate your lungs.  A viral or bacterial respiratory infection.  Allergies.  Asthma.  Postnasal drip.  Smoking.  Acid backing up from the stomach into the esophagus (gastroesophageal reflux).  Certain medicines.  Chronic lung problems, including COPD (or rarely, lung cancer).  Other medical conditions such as heart failure.  Follow these instructions at home: Pay attention to any changes in your symptoms. Take these actions to help with your discomfort:  Take medicines only as told by your health care provider. ? If you were prescribed an antibiotic medicine, take it as told by your health care provider. Do not stop taking the antibiotic even if you start to feel better. ? Talk with your health care provider before you take a cough suppressant medicine.  Drink enough fluid to keep your urine clear or pale yellow.  If the air is dry, use a cold steam vaporizer or humidifier in your bedroom or your home to help loosen secretions.  Avoid anything that causes you to cough at work or at home.  If your cough is worse at night,  try sleeping in a semi-upright position.  Avoid cigarette smoke. If you smoke, quit smoking. If you need help quitting, ask your health care provider.  Avoid caffeine.  Avoid alcohol.  Rest as needed.  Contact a health care provider if:  You have new symptoms.  You cough up pus.  Your cough does not get better after 2-3 weeks, or your cough gets worse.  You cannot control your cough with suppressant medicines and you are losing sleep.  You develop pain that is getting worse or pain that is not controlled with pain medicines.  You have a fever.  You have unexplained weight loss.  You have night sweats. Get help  right away if:  You cough up blood.  You have difficulty breathing.  Your heartbeat is very fast. This information is not intended to replace advice given to you by your health care provider. Make sure you discuss any questions you have with your health care provider. Document Released: 06/04/2011 Document Revised: 05/13/2016 Document Reviewed: 02/12/2015 Elsevier Interactive Patient Education  [employer].

## 2018-01-24 ENCOUNTER — Encounter: Payer: Self-pay | Admitting: *Deleted

## 2018-01-24 NOTE — Telephone Encounter (Signed)
Patient would like to try albuteral inhaler. 90 day? [facility] in [place]

## 2018-01-26 ENCOUNTER — Other Ambulatory Visit: Payer: Self-pay | Admitting: Internal Medicine

## 2018-01-26 ENCOUNTER — Telehealth: Payer: Self-pay | Admitting: *Deleted

## 2018-01-26 DIAGNOSIS — J42 Unspecified chronic bronchitis: Secondary | ICD-10-CM

## 2018-01-26 MED ORDER — ALBUTEROL SULFATE HFA 108 (90 BASE) MCG/ACT IN AERS: 1.0000 | INHALATION_SPRAY | Freq: Four times a day (QID) | RESPIRATORY_TRACT | 12 refills | Status: DC | PRN

## 2018-01-26 NOTE — Telephone Encounter (Signed)
Copied from CRM [id number]. Topic: Quick Communication - Other Results >> Jan 26, 2018  1:29 PM Louie BunPalacios Medina, Rosey Batheresa D wrote: Patient called and said that he went to the pharmacy to look for the inhaler but was not there. He would likea call back when it has been sent in to his pharmacy. Please call patient back, thanks.

## 2018-01-26 NOTE — Telephone Encounter (Signed)
Albuterol Rx pending your approval. See mychart message. You mentioned that pt could try this temporarily for his cough. [facility] would [facility] [place].  Thanks

## 2018-01-27 ENCOUNTER — Other Ambulatory Visit: Payer: Self-pay | Admitting: *Deleted

## 2018-01-27 DIAGNOSIS — K219 Gastro-esophageal reflux disease without esophagitis: Secondary | ICD-10-CM

## 2018-01-27 MED ORDER — PANTOPRAZOLE SODIUM 40 MG PO TBEC: 40.0000 mg | DELAYED_RELEASE_TABLET | ORAL | 1 refills | Status: DC

## 2018-01-27 NOTE — Telephone Encounter (Signed)
Pt notified that Rx was sent on 01/26/18 @ 5:25pm.

## 2018-01-27 NOTE — Telephone Encounter (Signed)
[facility]

## 2018-03-30 ENCOUNTER — Other Ambulatory Visit (INDEPENDENT_AMBULATORY_CARE_PROVIDER_SITE_OTHER): Payer: PRIVATE HEALTH INSURANCE

## 2018-03-30 DIAGNOSIS — Z125 Encounter for screening for malignant neoplasm of prostate: Secondary | ICD-10-CM

## 2018-03-30 DIAGNOSIS — Z1159 Encounter for screening for other viral diseases: Secondary | ICD-10-CM

## 2018-03-30 DIAGNOSIS — E785 Hyperlipidemia, unspecified: Secondary | ICD-10-CM | POA: Diagnosis not present

## 2018-03-30 DIAGNOSIS — Z13818 Encounter for screening for other digestive system disorders: Secondary | ICD-10-CM

## 2018-03-30 DIAGNOSIS — R7303 Prediabetes: Secondary | ICD-10-CM

## 2018-03-30 DIAGNOSIS — Z1329 Encounter for screening for other suspected endocrine disorder: Secondary | ICD-10-CM

## 2018-03-30 LAB — URINALYSIS, ROUTINE W REFLEX MICROSCOPIC
Bilirubin Urine: NEGATIVE
HGB URINE DIPSTICK: NEGATIVE
Ketones, ur: NEGATIVE
Leukocytes, UA: NEGATIVE
Nitrite: NEGATIVE
PH: 6 (ref 5.0–8.0)
RBC / HPF: NONE SEEN (ref 0–?)
TOTAL PROTEIN, URINE-UPE24: NEGATIVE
Urine Glucose: NEGATIVE
Urobilinogen, UA: 0.2 (ref 0.0–1.0)
WBC, UA: NONE SEEN (ref 0–?)

## 2018-03-30 LAB — HEMOGLOBIN A1C: Hgb A1c MFr Bld: 5.5 % (ref 4.6–6.5)

## 2018-03-30 LAB — CBC WITH DIFFERENTIAL/PLATELET
BASOS PCT: 1 % (ref 0.0–3.0)
Basophils Absolute: 0.1 10*3/uL (ref 0.0–0.1)
EOS ABS: 0 10*3/uL (ref 0.0–0.7)
Eosinophils Relative: 0.6 % (ref 0.0–5.0)
HCT: 40.1 % (ref 39.0–52.0)
Hemoglobin: 13.7 g/dL (ref 13.0–17.0)
Lymphocytes Relative: 31 % (ref 12.0–46.0)
Lymphs Abs: 1.6 10*3/uL (ref 0.7–4.0)
MCHC: 34.1 g/dL (ref 30.0–36.0)
MCV: 87.1 fl (ref 78.0–100.0)
Monocytes Absolute: 0.6 10*3/uL (ref 0.1–1.0)
Monocytes Relative: 11 % (ref 3.0–12.0)
NEUTROS ABS: 2.9 10*3/uL (ref 1.4–7.7)
Neutrophils Relative %: 56.4 % (ref 43.0–77.0)
PLATELETS: 226 10*3/uL (ref 150.0–400.0)
RBC: 4.61 Mil/uL (ref 4.22–5.81)
RDW: 13 % (ref 11.5–15.5)
WBC: 5.1 10*3/uL (ref 4.0–10.5)

## 2018-03-30 LAB — PSA: PSA: 0.23 ng/mL (ref 0.10–4.00)

## 2018-03-30 LAB — COMPREHENSIVE METABOLIC PANEL
ALT: 17 U/L (ref 0–53)
AST: 17 U/L (ref 0–37)
Albumin: 4.6 g/dL (ref 3.5–5.2)
Alkaline Phosphatase: 43 U/L (ref 39–117)
BUN: 14 mg/dL (ref 6–23)
CALCIUM: 9.3 mg/dL (ref 8.4–10.5)
CHLORIDE: 102 meq/L (ref 96–112)
CO2: 30 mEq/L (ref 19–32)
Creatinine, Ser: 0.97 mg/dL (ref 0.40–1.50)
GFR: 84.04 mL/min (ref >60.00)
Glucose, Bld: 94 mg/dL (ref 70–99)
POTASSIUM: 4.2 meq/L (ref 3.5–5.1)
Sodium: 139 mEq/L (ref 135–145)
Total Bilirubin: 0.8 mg/dL (ref 0.2–1.2)
Total Protein: 7.1 g/dL (ref 6.0–8.3)

## 2018-03-30 LAB — TSH: TSH: 1.59 u[IU]/mL (ref 0.35–4.50)

## 2018-03-30 LAB — LIPID PANEL
CHOLESTEROL: 194 mg/dL (ref 0–200)
HDL: 55.2 mg/dL (ref >39.00)
LDL CALC: 126 mg/dL — AB (ref 0–99)
NonHDL: 138.88
TRIGLYCERIDES: 65 mg/dL (ref 0.0–149.0)
Total CHOL/HDL Ratio: 4
VLDL: 13 mg/dL (ref 0.0–40.0)

## 2018-03-30 LAB — T4, FREE: Free T4: 0.8 ng/dL (ref 0.60–1.60)

## 2018-03-31 ENCOUNTER — Encounter: Payer: Self-pay | Admitting: Internal Medicine

## 2018-03-31 ENCOUNTER — Ambulatory Visit: Payer: PRIVATE HEALTH INSURANCE | Admitting: Internal Medicine

## 2018-03-31 DIAGNOSIS — K219 Gastro-esophageal reflux disease without esophagitis: Secondary | ICD-10-CM | POA: Diagnosis not present

## 2018-03-31 DIAGNOSIS — Z23 Encounter for immunization: Secondary | ICD-10-CM

## 2018-03-31 DIAGNOSIS — J309 Allergic rhinitis, unspecified: Secondary | ICD-10-CM

## 2018-03-31 LAB — HEPATITIS B SURFACE ANTIGEN: HEP B S AG: NONREACTIVE

## 2018-03-31 LAB — HEPATITIS B SURFACE ANTIBODY, QUANTITATIVE

## 2018-03-31 LAB — HEPATITIS C ANTIBODY
Hepatitis C Ab: NONREACTIVE
SIGNAL TO CUT-OFF: 0.02 (ref <1.00)

## 2018-03-31 MED ORDER — PANTOPRAZOLE SODIUM 40 MG PO TBEC: 40.0000 mg | DELAYED_RELEASE_TABLET | ORAL | 3 refills | Status: DC

## 2018-03-31 MED ORDER — FLUTICASONE PROPIONATE 50 MCG/ACT NA SUSP: 1.0000 | Freq: Every day | NASAL | 11 refills | Status: DC
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
## 2018-03-31 NOTE — Progress Notes (Signed)
Pre visit review using our clinic review tool, if applicable. No additional management support is needed unless otherwise documented below in the visit note. 

## 2018-03-31 NOTE — Patient Instructions (Addendum)
Please f/u in 1 year sooner if needed  You will need hep B vaccine today, in 1 month from today and then in 6 months from today please schedule   Hepatitis B Vaccine, Recombinant injection What is this medicine? HEPATITIS B VACCINE (hep uh TAHY tis B VAK seen) is a vaccine. It is used to prevent an infection with the hepatitis B virus. This medicine may be used for other purposes; ask your health care provider or pharmacist if you have questions. COMMON BRAND NAME(S): Engerix-B, Recombivax HB What should I tell my health care provider before I take this medicine? They need to know if you have any of these conditions: -fever, infection -heart disease -hepatitis B infection -immune system problems -kidney disease -an unusual or allergic reaction to vaccines, yeast, other medicines, foods, dyes, or preservatives -pregnant or trying to get pregnant -breast-feeding How should I use this medicine? This vaccine is for injection into a muscle. It is given by a health care professional. A copy of Vaccine Information Statements will be given before each vaccination. Read this sheet carefully each time. The sheet may change frequently. Talk to your pediatrician regarding the use of this medicine in children. While this drug may be prescribed for children as young as newborn for selected conditions, precautions do apply. Overdosage: If you think you have taken too much of this medicine contact a poison control center or emergency room at once. NOTE: This medicine is only for you. Do not share this medicine with others. What if I miss a dose? It is important not to miss your dose. Call your doctor or health care professional if you are unable to keep an appointment. What may interact with this medicine? -medicines that suppress your immune function like adalimumab, anakinra, infliximab -medicines to treat cancer -steroid medicines like prednisone or cortisone This list may not describe all possible  interactions. Give your health care provider a list of all the medicines, herbs, non-prescription drugs, or dietary supplements you use. Also tell them if you smoke, drink alcohol, or use illegal drugs. Some items may interact with your medicine. What should I watch for while using this medicine? See your health care provider for all shots of this vaccine as directed. You must have 3 shots of this vaccine for protection from hepatitis B infection. Tell your doctor right away if you have any serious or unusual side effects after getting this vaccine. What side effects may I notice from receiving this medicine? Side effects that you should report to your doctor or health care professional as soon as possible: -allergic reactions like skin rash, itching or hives, swelling of the face, lips, or tongue -breathing problems -confused, irritated -fast, irregular heartbeat -flu-like syndrome -numb, tingling pain -seizures -unusually weak or tired Side effects that usually do not require medical attention (report to your doctor or health care professional if they continue or are bothersome): -diarrhea -fever -headache -loss of appetite -muscle pain -nausea -pain, redness, swelling, or irritation at site where injected -tiredness This list may not describe all possible side effects. Call your doctor for medical advice about side effects. You may report side effects to FDA at 1-800-FDA-1088. Where should I keep my medicine? This drug is given in a hospital or clinic and will not be stored at home. NOTE: This sheet is a summary. It may not cover all possible information. If you have questions about this medicine, talk to your doctor, pharmacist, or health care provider.  2018 Elsevier/Gold Standard (2014-04-08 13:26:01)  Gastroesophageal Reflux Disease, Adult Normally, food travels down the esophagus and stays in the stomach to be digested. If a person has gastroesophageal reflux disease (GERD),  food and stomach acid move back up into the esophagus. When this happens, the esophagus becomes sore and swollen (inflamed). Over time, GERD can make small holes (ulcers) in the lining of the esophagus. Follow these instructions at home: Diet  Follow a diet as told by your doctor. You may need to avoid foods and drinks such as: ? Coffee and tea (with or without caffeine). ? Drinks that contain alcohol. ? Energy drinks and sports drinks. ? Carbonated drinks or sodas. ? Chocolate and cocoa. ? Peppermint and mint flavorings. ? Garlic and onions. ? Horseradish. ? Spicy and acidic foods, such as peppers, chili powder, curry powder, vinegar, hot sauces, and BBQ sauce. ? Citrus fruit juices and citrus fruits, such as oranges, lemons, and limes. ? Tomato-based foods, such as red sauce, chili, salsa, and pizza with red sauce. ? Fried and fatty foods, such as donuts, french fries, potato chips, and high-fat dressings. ? High-fat meats, such as hot dogs, rib eye steak, sausage, ham, and bacon. ? High-fat dairy items, such as whole milk, butter, and cream cheese.  Eat small meals often. Avoid eating large meals.  Avoid drinking large amounts of liquid with your meals.  Avoid eating meals during the 2-3 hours before bedtime.  Avoid lying down right after you eat.  Do not exercise right after you eat. General instructions  Pay attention to any changes in your symptoms.  Take over-the-counter and prescription medicines only as told by your doctor. Do not take aspirin, ibuprofen, or other NSAIDs unless your doctor says it is okay.  Do not use any tobacco products, including cigarettes, chewing tobacco, and e-cigarettes. If you need help quitting, ask your doctor.  Wear loose clothes. Do not wear anything tight around your waist.  Raise (elevate) the head of your bed about 6 inches (15 cm).  Try to lower your stress. If you need help doing this, ask your doctor.  If you are overweight, lose  an amount of weight that is healthy for you. Ask your doctor about a safe weight loss goal.  Keep all follow-up visits as told by your doctor. This is important. Contact a doctor if:  You have new symptoms.  You lose weight and you do not know why it is happening.  You have trouble swallowing, or it hurts to swallow.  You have wheezing or a cough that keeps happening.  Your symptoms do not get better with treatment.  You have a hoarse voice. Get help right away if:  You have pain in your arms, neck, jaw, teeth, or back.  You feel sweaty, dizzy, or light-headed.  You have chest pain or shortness of breath.  You throw up (vomit) and your throw up looks like blood or coffee grounds.  You pass out (faint).  Your poop (stool) is bloody or black.  You cannot swallow, drink, or eat. This information is not intended to replace advice given to you by your health care provider. Make sure you discuss any questions you have with your health care provider. Document Released: 05/24/2008 Document Revised: 05/13/2016 Document Reviewed: 04/02/2015 Elsevier Interactive Patient Education  [employer].  Results for PAVLE, WILER (MRN [id number]) as of 03/31/2018 14:13  Ref. Range 03/30/2018 10:50  Sodium Latest Ref Range: 135 - 145 mEq/L 139  Potassium Latest Ref Range: 3.5 - 5.1 mEq/L  4.2  Chloride Latest Ref Range: 96 - 112 mEq/L 102  CO2 Latest Ref Range: 19 - 32 mEq/L 30  Glucose Latest Ref Range: 70 - 99 mg/dL 94  BUN Latest Ref Range: 6 - 23 mg/dL 14  Creatinine Latest Ref Range: 0.40 - 1.50 mg/dL 1.61  Calcium Latest Ref Range: 8.4 - 10.5 mg/dL 9.3  Alkaline Phosphatase Latest Ref Range: 39 - 117 U/L 43  Albumin Latest Ref Range: 3.5 - 5.2 g/dL 4.6  AST Latest Ref Range: 0 - 37 U/L 17  ALT Latest Ref Range: 0 - 53 U/L 17  Total Protein Latest Ref Range: 6.0 - 8.3 g/dL 7.1  Total Bilirubin Latest Ref Range: 0.2 - 1.2 mg/dL 0.8  GFR Latest Ref Range: >60.00 mL/min 84.04    Total CHOL/HDL Ratio Unknown 4  Cholesterol Latest Ref Range: 0 - 200 mg/dL 096  HDL Cholesterol Latest Ref Range: >39.00 mg/dL 04.54  LDL (calc) Latest Ref Range: 0 - 99 mg/dL 098 (H)  NonHDL Unknown 138.88  Triglycerides Latest Ref Range: 0.0 - 149.0 mg/dL 11.9  VLDL Latest Ref Range: 0.0 - 40.0 mg/dL 14.7  WBC Latest Ref Range: 4.0 - 10.5 K/uL 5.1  RBC Latest Ref Range: 4.22 - 5.81 Mil/uL 4.61  Hemoglobin Latest Ref Range: 13.0 - 17.0 g/dL 82.9  HCT Latest Ref Range: 39.0 - 52.0 % 40.1  MCV Latest Ref Range: 78.0 - 100.0 fl 87.1  MCHC Latest Ref Range: 30.0 - 36.0 g/dL 56.2  RDW Latest Ref Range: 11.5 - 15.5 % 13.0  Platelets Latest Ref Range: 150.0 - 400.0 K/uL 226.0  Neutrophils Latest Ref Range: 43.0 - 77.0 % 56.4  Lymphocytes Latest Ref Range: 12.0 - 46.0 % 31.0  Monocytes Relative Latest Ref Range: 3.0 - 12.0 % 11.0  Eosinophil Latest Ref Range: 0.0 - 5.0 % 0.6  Basophil Latest Ref Range: 0.0 - 3.0 % 1.0  NEUT# Latest Ref Range: 1.4 - 7.7 K/uL 2.9  Lymphocyte # Latest Ref Range: 0.7 - 4.0 K/uL 1.6  Monocyte # Latest Ref Range: 0.1 - 1.0 K/uL 0.6  Eosinophils Absolute Latest Ref Range: 0.0 - 0.7 K/uL 0.0  Basophils Absolute Latest Ref Range: 0.0 - 0.1 K/uL 0.1  Hemoglobin A1C Latest Ref Range: 4.6 - 6.5 % 5.5  TSH Latest Ref Range: 0.35 - 4.50 uIU/mL 1.59  T4,Free(Direct) Latest Ref Range: 0.60 - 1.60 ng/dL 1.30  PSA Latest Ref Range: 0.10 - 4.00 ng/mL 0.23  Hepatitis B Surface Ag Latest Ref Range: NON-REACTI  NON-REACTIVE  Hepatitis B-Post Latest Ref Range: > OR = 10 mIU/mL <5 (L)  Hepatitis C Ab Latest Ref Range: NON-REACTI  NON-REACTIVE  SIGNAL TO CUT-OFF Latest Ref Range: <1.00  0.02  URINALYSIS, ROUTINE W REFLEX MICROSCOPIC Unknown Rpt (A)  Appearance Latest Ref Range: Clear  CLEAR  Bilirubin Urine Latest Ref Range: Negative  NEGATIVE  Color, Urine Latest Ref Range: Yellow;Lt. Yellow  YELLOW  Hgb urine dipstick Latest Ref Range: Negative  NEGATIVE  Ketones, ur  Latest Ref Range: Negative  NEGATIVE  Leukocytes, UA Latest Ref Range: Negative  NEGATIVE  Nitrite Latest Ref Range: Negative  NEGATIVE  pH Latest Ref Range: 5.0 - 8.0  6.0  Specific Gravity, Urine Latest Ref Range: 1.000 - 1.030  <=1.005 (A)  Urine Glucose Latest Ref Range: Negative  NEGATIVE  Urobilinogen, UA Latest Ref Range: 0.0 - 1.0  0.2  RBC / HPF Latest Ref Range: 0-2/hpf  none seen  WBC, UA Latest Ref Range: 0-2/hpf  none seen  Total Protein, Urine-UPE24 Latest Ref Range: Negative  NEGATIVE

## 2018-05-02 ENCOUNTER — Ambulatory Visit: Payer: PRIVATE HEALTH INSURANCE

## 2018-05-09 ENCOUNTER — Ambulatory Visit: Payer: PRIVATE HEALTH INSURANCE

## 2018-05-18 ENCOUNTER — Ambulatory Visit (INDEPENDENT_AMBULATORY_CARE_PROVIDER_SITE_OTHER): Payer: PRIVATE HEALTH INSURANCE | Admitting: *Deleted

## 2018-05-18 DIAGNOSIS — Z23 Encounter for immunization: Secondary | ICD-10-CM

## 2018-10-31 ENCOUNTER — Ambulatory Visit (INDEPENDENT_AMBULATORY_CARE_PROVIDER_SITE_OTHER): Payer: PRIVATE HEALTH INSURANCE

## 2018-10-31 DIAGNOSIS — Z23 Encounter for immunization: Secondary | ICD-10-CM | POA: Diagnosis not present

## 2018-10-31 NOTE — Progress Notes (Signed)
Patient comes in for Hepatitis B injection. Injected right deltoid .  Flu shot administered in left deltoid. Patient tolerated injections well.

## 2019-04-02 ENCOUNTER — Encounter: Payer: PRIVATE HEALTH INSURANCE | Admitting: Internal Medicine

## 2019-04-03 ENCOUNTER — Encounter: Payer: PRIVATE HEALTH INSURANCE | Admitting: Internal Medicine

## 2019-05-18 ENCOUNTER — Encounter: Payer: PRIVATE HEALTH INSURANCE | Admitting: Internal Medicine

## 2019-07-03 ENCOUNTER — Encounter: Payer: PRIVATE HEALTH INSURANCE | Admitting: Internal Medicine

## 2019-08-06 ENCOUNTER — Other Ambulatory Visit: Payer: Self-pay

## 2019-08-09 ENCOUNTER — Encounter: Payer: Self-pay | Admitting: Internal Medicine

## 2019-08-09 ENCOUNTER — Ambulatory Visit (INDEPENDENT_AMBULATORY_CARE_PROVIDER_SITE_OTHER): Payer: PRIVATE HEALTH INSURANCE | Admitting: Internal Medicine

## 2019-08-09 ENCOUNTER — Other Ambulatory Visit: Payer: Self-pay

## 2019-08-09 VITALS — BP 110/70 | HR 56 | Temp 98.2°F | Ht 70.0 in | Wt 190.0 lb

## 2019-08-09 DIAGNOSIS — E559 Vitamin D deficiency, unspecified: Secondary | ICD-10-CM

## 2019-08-09 DIAGNOSIS — J309 Allergic rhinitis, unspecified: Secondary | ICD-10-CM | POA: Diagnosis not present

## 2019-08-09 DIAGNOSIS — Z1329 Encounter for screening for other suspected endocrine disorder: Secondary | ICD-10-CM

## 2019-08-09 DIAGNOSIS — Z0000 Encounter for general adult medical examination without abnormal findings: Secondary | ICD-10-CM

## 2019-08-09 DIAGNOSIS — Z20828 Contact with and (suspected) exposure to other viral communicable diseases: Secondary | ICD-10-CM

## 2019-08-09 DIAGNOSIS — Z23 Encounter for immunization: Secondary | ICD-10-CM

## 2019-08-09 DIAGNOSIS — R739 Hyperglycemia, unspecified: Secondary | ICD-10-CM

## 2019-08-09 DIAGNOSIS — R001 Bradycardia, unspecified: Secondary | ICD-10-CM

## 2019-08-09 DIAGNOSIS — K219 Gastro-esophageal reflux disease without esophagitis: Secondary | ICD-10-CM

## 2019-08-09 DIAGNOSIS — Z1389 Encounter for screening for other disorder: Secondary | ICD-10-CM

## 2019-08-09 DIAGNOSIS — Z13220 Encounter for screening for lipoid disorders: Secondary | ICD-10-CM

## 2019-08-09 DIAGNOSIS — Z13818 Encounter for screening for other digestive system disorders: Secondary | ICD-10-CM

## 2019-08-09 DIAGNOSIS — Z0184 Encounter for antibody response examination: Secondary | ICD-10-CM

## 2019-08-09 DIAGNOSIS — Z20822 Contact with and (suspected) exposure to covid-19: Secondary | ICD-10-CM

## 2019-08-09 DIAGNOSIS — Z125 Encounter for screening for malignant neoplasm of prostate: Secondary | ICD-10-CM

## 2019-08-09 MED ORDER — FLUTICASONE PROPIONATE 50 MCG/ACT NA SUSP: 1.0000 | Freq: Every day | NASAL | 4 refills | Status: DC

## 2019-08-09 MED ORDER — PANTOPRAZOLE SODIUM 20 MG PO TBEC: 20.0000 mg | DELAYED_RELEASE_TABLET | ORAL | 3 refills | Status: DC

## 2019-08-09 NOTE — Patient Instructions (Addendum)
Try L theanine 100 mg or Tranquil sleep Stress relax brand     Bradycardia, Adult Bradycardia is a slower-than-normal heartbeat. A normal resting heart rate for an adult ranges from 60 to 100 beats per minute. With bradycardia, the resting heart rate is less than 60 beats per minute. Bradycardia can prevent enough oxygen from reaching certain areas of your body when you are active. It can be serious if it keeps enough oxygen from reaching your brain and other parts of your body. Bradycardia is not a problem for everyone. For some healthy adults, a slow resting heart rate is normal. What are the causes? This condition may be caused by:  A problem with the heart, including: ? A problem with the heart's electrical system, such as a heart block. With a heart block, electrical signals between the chambers of the heart are partially or completely blocked, so they are not able to work as they should. ? A problem with the heart's natural pacemaker (sinus node). ? Heart disease. ? A heart attack. ? Heart damage. ? Lyme disease. ? A heart infection. ? A heart condition that is present at birth (congenital heart defect).  Certain medicines that treat heart conditions.  Certain conditions, such as hypothyroidism and obstructive sleep apnea.  Problems with the balance of chemicals and other substances, like potassium, in the blood.  Trauma.  Radiation therapy. What increases the risk? You are more likely to develop this condition if you:  Are age 61 or older.  Have high blood pressure (hypertension), high cholesterol (hyperlipidemia), or diabetes.  Drink heavily, use tobacco or nicotine products, or use drugs. What are the signs or symptoms? Symptoms of this condition include:  Light-headedness.  Feeling faint or fainting.  Fatigue and weakness.  Trouble with activity or exercise.  Shortness of breath.  Chest pain (angina).  Drowsiness.  Confusion.  Dizziness. How is this  diagnosed? This condition may be diagnosed based on:  Your symptoms.  Your medical history.  A physical exam. During the exam, your health care provider will listen to your heartbeat and check your pulse. To confirm the diagnosis, your health care provider may order tests, such as:  Blood tests.  An electrocardiogram (ECG). This test records the heart's electrical activity. The test can show how fast your heart is beating and whether the heartbeat is steady.  A test in which you wear a portable device (event recorder or Holter monitor) to record your heart's electrical activity while you go about your day.  Anexercise test. How is this treated? Treatment for this condition depends on the cause of the condition and how severe your symptoms are. Treatment may involve:  Treatment of the underlying condition.  Changing your medicines or how much medicine you take.  Having a small, battery-operated device called a pacemaker implanted under the skin. When bradycardia occurs, this device can be used to increase your heart rate and help your heart beat in a regular rhythm. Follow these instructions at home: Lifestyle   Manage any health conditions that contribute to bradycardia as told by your health care provider.  Follow a heart-healthy diet. A nutrition specialist (dietitian) can help educate you about healthy food options and changes.  Follow an exercise program that is approved by your health care provider.  Maintain a healthy weight.  Try to reduce or manage your stress, such as with yoga or meditation. If you need help reducing stress, ask your health care provider.  Do not use any products that  contain nicotine or tobacco, such as cigarettes, e-cigarettes, and chewing tobacco. If you need help quitting, ask your health care provider.  Do not use illegal drugs.  Limit alcohol intake to no more than 1 drink a day for nonpregnant women and 2 drinks a day for men. Be aware of  how much alcohol is in your drink. In the [place] one drink equals one 12 oz bottle of beer (355 mL), one 5 oz glass of wine (148 mL), or one 1 oz glass of hard liquor (44 mL). General instructions  Take over-the-counter and prescription medicines only as told by your health care provider.  Keep all follow-up visits as told by your health care provider. This is important. How is this prevented? In some cases, bradycardia may be prevented by:  Treating underlying medical problems.  Stopping behaviors or medicines that can trigger the condition. Contact a health care provider if you:  Feel light-headed or dizzy.  Almost faint.  Feel weak or are easily fatigued during physical activity.  Experience confusion or have memory problems. Get help right away if:  You faint.  You have: ? An irregular heartbeat (palpitations). ? Chest pain. ? Trouble breathing. Summary  Bradycardia is a slower-than-normal heartbeat. With bradycardia, the resting heart rate is less than 60 beats per minute.  Treatment for this condition depends on the cause.  Manage any health conditions that contribute to bradycardia as told by your health care provider.  Do not use any products that contain nicotine or tobacco, such as cigarettes, e-cigarettes, and chewing tobacco, and limit alcohol intake.  Keep all follow-up visits as told by your health care provider. This is important. This information is not intended to replace advice given to you by your health care provider. Make sure you discuss any questions you have with your health care provider. Document Released: 08/28/2002 Document Revised: 06/19/2018 Document Reviewed: 05/17/2018 Elsevier Patient Education  2020 [employer] Insomnia Insomnia is a sleep disorder that makes it difficult to fall asleep or stay asleep. Insomnia can cause fatigue, low energy, difficulty concentrating, mood swings, and poor performance at work or school. There are three  different ways to classify insomnia:  Difficulty falling asleep.  Difficulty staying asleep.  Waking up too early in the morning. Any type of insomnia can be long-term (chronic) or short-term (acute). Both are common. Short-term insomnia usually lasts for three months or less. Chronic insomnia occurs at least three times a week for longer than three months. What are the causes? Insomnia may be caused by another condition, situation, or substance, such as:  Anxiety.  Certain medicines.  Gastroesophageal reflux disease (GERD) or other gastrointestinal conditions.  Asthma or other breathing conditions.  Restless legs syndrome, sleep apnea, or other sleep disorders.  Chronic pain.  Menopause.  Stroke.  Abuse of alcohol, tobacco, or illegal drugs.  Mental health conditions, such as depression.  Caffeine.  Neurological disorders, such as Alzheimer's disease.  An overactive thyroid (hyperthyroidism). Sometimes, the cause of insomnia may not be known. What increases the risk? Risk factors for insomnia include:  Gender. Women are affected more often than men.  Age. Insomnia is more common as you get older.  Stress.  Lack of exercise.  Irregular work schedule or working night shifts.  Traveling between different time zones.  Certain medical and mental health conditions. What are the signs or symptoms? If you have insomnia, the main symptom is having trouble falling asleep or having trouble staying asleep. This may lead to other  symptoms, such as:  Feeling fatigued or having low energy.  Feeling nervous about going to sleep.  Not feeling rested in the morning.  Having trouble concentrating.  Feeling irritable, anxious, or depressed. How is this diagnosed? This condition may be diagnosed based on:  Your symptoms and medical history. Your health care provider may ask about: ? Your sleep habits. ? Any medical conditions you have. ? Your mental health.  A  physical exam. How is this treated? Treatment for insomnia depends on the cause. Treatment may focus on treating an underlying condition that is causing insomnia. Treatment may also include:  Medicines to help you sleep.  Counseling or therapy.  Lifestyle adjustments to help you sleep better. Follow these instructions at home: Eating and drinking   Limit or avoid alcohol, caffeinated beverages, and cigarettes, especially close to bedtime. These can disrupt your sleep.  Do not eat a large meal or eat spicy foods right before bedtime. This can lead to digestive discomfort that can make it hard for you to sleep. Sleep habits   Keep a sleep diary to help you and your health care provider figure out what could be causing your insomnia. Write down: ? When you sleep. ? When you wake up during the night. ? How well you sleep. ? How rested you feel the next day. ? Any side effects of medicines you are taking. ? What you eat and drink.  Make your bedroom a dark, comfortable place where it is easy to fall asleep. ? Put up shades or blackout curtains to block light from outside. ? Use a white noise machine to block noise. ? Keep the temperature cool.  Limit screen use before bedtime. This includes: ? Watching TV. ? Using your smartphone, tablet, or computer.  Stick to a routine that includes going to bed and waking up at the same times every day and night. This can help you fall asleep faster. Consider making a quiet activity, such as reading, part of your nighttime routine.  Try to avoid taking naps during the day so that you sleep better at night.  Get out of bed if you are still awake after 15 minutes of trying to sleep. Keep the lights down, but try reading or doing a quiet activity. When you feel sleepy, go back to bed. General instructions  Take over-the-counter and prescription medicines only as told by your health care provider.  Exercise regularly, as told by your health  care provider. Avoid exercise starting several hours before bedtime.  Use relaxation techniques to manage stress. Ask your health care provider to suggest some techniques that may work well for you. These may include: ? Breathing exercises. ? Routines to release muscle tension. ? Visualizing peaceful scenes.  Make sure that you drive carefully. Avoid driving if you feel very sleepy.  Keep all follow-up visits as told by your health care provider. This is important. Contact a health care provider if:  You are tired throughout the day.  You have trouble in your daily routine due to sleepiness.  You continue to have sleep problems, or your sleep problems get worse. Get help right away if:  You have serious thoughts about hurting yourself or someone else. If you ever feel like you may hurt yourself or others, or have thoughts about taking your own life, get help right away. You can go to your nearest emergency department or call:  Your local emergency services (911 in the [place]  A suicide crisis helpline, such as  the National Suicide Prevention Lifeline at 629-190-4767. This is open 24 hours a day. Summary  Insomnia is a sleep disorder that makes it difficult to fall asleep or stay asleep.  Insomnia can be long-term (chronic) or short-term (acute).  Treatment for insomnia depends on the cause. Treatment may focus on treating an underlying condition that is causing insomnia.  Keep a sleep diary to help you and your health care provider figure out what could be causing your insomnia. This information is not intended to replace advice given to you by your health care provider. Make sure you discuss any questions you have with your health care provider. Document Released: 12/03/2000 Document Revised: 11/18/2017 Document Reviewed: 09/15/2017 Elsevier Patient Education  2020 [employer]  Gastroesophageal Reflux Disease, Adult Gastroesophageal reflux (GER) happens when acid from the  stomach flows up into the tube that connects the mouth and the stomach (esophagus). Normally, food travels down the esophagus and stays in the stomach to be digested. However, when a person has GER, food and stomach acid sometimes move back up into the esophagus. If this becomes a more serious problem, the person may be diagnosed with a disease called gastroesophageal reflux disease (GERD). GERD occurs when the reflux:  Happens often.  Causes frequent or severe symptoms.  Causes problems such as damage to the esophagus. When stomach acid comes in contact with the esophagus, the acid may cause soreness (inflammation) in the esophagus. Over time, GERD may create small holes (ulcers) in the lining of the esophagus. What are the causes? This condition is caused by a problem with the muscle between the esophagus and the stomach (lower esophageal sphincter, or LES). Normally, the LES muscle closes after food passes through the esophagus to the stomach. When the LES is weakened or abnormal, it does not close properly, and that allows food and stomach acid to go back up into the esophagus. The LES can be weakened by certain dietary substances, medicines, and medical conditions, including:  Tobacco use.  Pregnancy.  Having a hiatal hernia.  Alcohol use.  Certain foods and beverages, such as coffee, chocolate, onions, and peppermint. What increases the risk? You are more likely to develop this condition if you:  Have an increased body weight.  Have a connective tissue disorder.  Use NSAID medicines. What are the signs or symptoms? Symptoms of this condition include:  Heartburn.  Difficult or painful swallowing.  The feeling of having a lump in the throat.  Abitter taste in the mouth.  Bad breath.  Having a large amount of saliva.  Having an upset or bloated stomach.  Belching.  Chest pain. Different conditions can cause chest pain. Make sure you see your health care provider if  you experience chest pain.  Shortness of breath or wheezing.  Ongoing (chronic) cough or a night-time cough.  Wearing away of tooth enamel.  Weight loss. How is this diagnosed? Your health care provider will take a medical history and perform a physical exam. To determine if you have mild or severe GERD, your health care provider may also monitor how you respond to treatment. You may also have tests, including:  A test to examine your stomach and esophagus with a small camera (endoscopy).  A test thatmeasures the acidity level in your esophagus.  A test thatmeasures how much pressure is on your esophagus.  A barium swallow or modified barium swallow test to show the shape, size, and functioning of your esophagus. How is this treated? The goal of treatment  is to help relieve your symptoms and to prevent complications. Treatment for this condition may vary depending on how severe your symptoms are. Your health care provider may recommend:  Changes to your diet.  Medicine.  Surgery. Follow these instructions at home: Eating and drinking   Follow a diet as recommended by your health care provider. This may involve avoiding foods and drinks such as: ? Coffee and tea (with or without caffeine). ? Drinks that containalcohol. ? Energy drinks and sports drinks. ? Carbonated drinks or sodas. ? Chocolate and cocoa. ? Peppermint and mint flavorings. ? Garlic and onions. ? Horseradish. ? Spicy and acidic foods, including peppers, chili powder, curry powder, vinegar, hot sauces, and barbecue sauce. ? Citrus fruit juices and citrus fruits, such as oranges, lemons, and limes. ? Tomato-based foods, such as red sauce, chili, salsa, and pizza with red sauce. ? Fried and fatty foods, such as donuts, french fries, potato chips, and high-fat dressings. ? High-fat meats, such as hot dogs and fatty cuts of red and white meats, such as rib eye steak, sausage, ham, and bacon. ? High-fat dairy  items, such as whole milk, butter, and cream cheese.  Eat small, frequent meals instead of large meals.  Avoid drinking large amounts of liquid with your meals.  Avoid eating meals during the 2-3 hours before bedtime.  Avoid lying down right after you eat.  Do not exercise right after you eat. Lifestyle   Do not use any products that contain nicotine or tobacco, such as cigarettes, e-cigarettes, and chewing tobacco. If you need help quitting, ask your health care provider.  Try to reduce your stress by using methods such as yoga or meditation. If you need help reducing stress, ask your health care provider.  If you are overweight, reduce your weight to an amount that is healthy for you. Ask your health care provider for guidance about a safe weight loss goal. General instructions  Pay attention to any changes in your symptoms.  Take over-the-counter and prescription medicines only as told by your health care provider. Do not take aspirin, ibuprofen, or other NSAIDs unless your health care provider told you to do so.  Wear loose-fitting clothing. Do not wear anything tight around your waist that causes pressure on your abdomen.  Raise (elevate) the head of your bed about 6 inches (15 cm).  Avoid bending over if this makes your symptoms worse.  Keep all follow-up visits as told by your health care provider. This is important. Contact a health care provider if:  You have: ? New symptoms. ? Unexplained weight loss. ? Difficulty swallowing or it hurts to swallow. ? Wheezing or a persistent cough. ? A hoarse voice.  Your symptoms do not improve with treatment. Get help right away if you:  Have pain in your arms, neck, jaw, teeth, or back.  Feel sweaty, dizzy, or light-headed.  Have chest pain or shortness of breath.  Vomit and your vomit looks like blood or coffee grounds.  Faint.  Have stool that is bloody or black.  Cannot swallow, drink, or eat. Summary   Gastroesophageal reflux happens when acid from the stomach flows up into the esophagus. GERD is a disease in which the reflux happens often, causes frequent or severe symptoms, or causes problems such as damage to the esophagus.  Treatment for this condition may vary depending on how severe your symptoms are. Your health care provider may recommend diet and lifestyle changes, medicine, or surgery.  Contact a health  care provider if you have new or worsening symptoms.  Take over-the-counter and prescription medicines only as told by your health care provider. Do not take aspirin, ibuprofen, or other NSAIDs unless your health care provider told you to do so.  Keep all follow-up visits as told by your health care provider. This is important. This information is not intended to replace advice given to you by your health care provider. Make sure you discuss any questions you have with your health care provider. Document Released: 09/15/2005 Document Revised: 06/14/2018 Document Reviewed: 06/14/2018 Elsevier Patient Education  2020 [place] for Gastroesophageal Reflux Disease, Adult When you have gastroesophageal reflux disease (GERD), the foods you eat and your eating habits are very important. Choosing the right foods can help ease the discomfort of GERD. Consider working with a diet and nutrition specialist (dietitian) to help you make healthy food choices. What general guidelines should I follow?  Eating plan  Choose healthy foods low in fat, such as fruits, vegetables, whole grains, low-fat dairy products, and lean meat, fish, and poultry.  Eat frequent, small meals instead of three large meals each day. Eat your meals slowly, in a relaxed setting. Avoid bending over or lying down until 2-3 hours after eating.  Limit high-fat foods such as fatty meats or fried foods.  Limit your intake of oils, butter, and shortening to less than 8 teaspoons each day.  Avoid the  following: ? Foods that cause symptoms. These may be different for different people. Keep a food diary to keep track of foods that cause symptoms. ? Alcohol. ? Drinking large amounts of liquid with meals. ? Eating meals during the 2-3 hours before bed.  Cook foods using methods other than frying. This may include baking, grilling, or broiling. Lifestyle  Maintain a healthy weight. Ask your health care provider what weight is healthy for you. If you need to lose weight, work with your health care provider to do so safely.  Exercise for at least 30 minutes on 5 or more days each week, or as told by your health care provider.  Avoid wearing clothes that fit tightly around your waist and chest.  Do not use any products that contain nicotine or tobacco, such as cigarettes and e-cigarettes. If you need help quitting, ask your health care provider.  Sleep with the head of your bed raised. Use a wedge under the mattress or blocks under the bed frame to raise the head of the bed. What foods are not recommended? The items listed may not be a complete list. Talk with your dietitian about what dietary choices are best for you. Grains Pastries or quick breads with added fat. [place] toast. Vegetables Deep fried vegetables. [place] fries. Any vegetables prepared with added fat. Any vegetables that cause symptoms. For some people this may include tomatoes and tomato products, chili peppers, onions and garlic, and horseradish. Fruits Any fruits prepared with added fat. Any fruits that cause symptoms. For some people this may include citrus fruits, such as oranges, grapefruit, pineapple, and lemons. Meats and other protein foods High-fat meats, such as fatty beef or pork, hot dogs, ribs, ham, sausage, salami and bacon. Fried meat or protein, including fried fish and fried chicken. Nuts and nut butters. Dairy Whole milk and chocolate milk. Sour cream. Cream. Ice cream. Cream cheese. Milk shakes. Beverages  Coffee and tea, with or without caffeine. Carbonated beverages. Sodas. Energy drinks. Fruit juice made with acidic fruits (such as orange or grapefruit). Tomato  juice. Alcoholic drinks. Fats and oils Butter. Margarine. Shortening. Ghee. Sweets and desserts Chocolate and cocoa. Donuts. Seasoning and other foods Pepper. Peppermint and spearmint. Any condiments, herbs, or seasonings that cause symptoms. For some people, this may include curry, hot sauce, or vinegar-based salad dressings. Summary  When you have gastroesophageal reflux disease (GERD), food and lifestyle choices are very important to help ease the discomfort of GERD.  Eat frequent, small meals instead of three large meals each day. Eat your meals slowly, in a relaxed setting. Avoid bending over or lying down until 2-3 hours after eating.  Limit high-fat foods such as fatty meat or fried foods. This information is not intended to replace advice given to you by your health care provider. Make sure you discuss any questions you have with your health care provider. Document Released: 12/06/2005 Document Revised: 03/29/2019 Document Reviewed: 12/07/2016 Elsevier Patient Education  2020 [employer]
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
## 2019-08-10 ENCOUNTER — Encounter: Payer: Self-pay | Admitting: Internal Medicine

## 2019-08-10 DIAGNOSIS — J309 Allergic rhinitis, unspecified: Secondary | ICD-10-CM | POA: Insufficient documentation

## 2019-08-10 DIAGNOSIS — R001 Bradycardia, unspecified: Secondary | ICD-10-CM | POA: Insufficient documentation

## 2019-08-24 ENCOUNTER — Other Ambulatory Visit (INDEPENDENT_AMBULATORY_CARE_PROVIDER_SITE_OTHER): Payer: PRIVATE HEALTH INSURANCE

## 2019-08-24 ENCOUNTER — Other Ambulatory Visit: Payer: Self-pay

## 2019-08-24 DIAGNOSIS — Z125 Encounter for screening for malignant neoplasm of prostate: Secondary | ICD-10-CM

## 2019-08-24 DIAGNOSIS — Z0184 Encounter for antibody response examination: Secondary | ICD-10-CM

## 2019-08-24 DIAGNOSIS — Z13220 Encounter for screening for lipoid disorders: Secondary | ICD-10-CM

## 2019-08-24 DIAGNOSIS — Z13818 Encounter for screening for other digestive system disorders: Secondary | ICD-10-CM

## 2019-08-24 DIAGNOSIS — Z1329 Encounter for screening for other suspected endocrine disorder: Secondary | ICD-10-CM | POA: Diagnosis not present

## 2019-08-24 DIAGNOSIS — E559 Vitamin D deficiency, unspecified: Secondary | ICD-10-CM

## 2019-08-24 DIAGNOSIS — Z0000 Encounter for general adult medical examination without abnormal findings: Secondary | ICD-10-CM

## 2019-08-24 DIAGNOSIS — R739 Hyperglycemia, unspecified: Secondary | ICD-10-CM

## 2019-08-24 DIAGNOSIS — Z20828 Contact with and (suspected) exposure to other viral communicable diseases: Secondary | ICD-10-CM

## 2019-08-24 DIAGNOSIS — Z20822 Contact with and (suspected) exposure to covid-19: Secondary | ICD-10-CM

## 2019-08-24 DIAGNOSIS — Z1389 Encounter for screening for other disorder: Secondary | ICD-10-CM

## 2019-08-24 LAB — COMPREHENSIVE METABOLIC PANEL
ALT: 16 U/L (ref 0–53)
AST: 17 U/L (ref 0–37)
Albumin: 4.2 g/dL (ref 3.5–5.2)
Alkaline Phosphatase: 39 U/L (ref 39–117)
BUN: 16 mg/dL (ref 6–23)
CO2: 31 mEq/L (ref 19–32)
Calcium: 9.3 mg/dL (ref 8.4–10.5)
Chloride: 102 mEq/L (ref 96–112)
Creatinine, Ser: 0.93 mg/dL (ref 0.40–1.50)
GFR: 82.62 mL/min (ref >60.00)
Glucose, Bld: 89 mg/dL (ref 70–99)
Potassium: 4.5 mEq/L (ref 3.5–5.1)
Sodium: 139 mEq/L (ref 135–145)
Total Bilirubin: 0.8 mg/dL (ref 0.2–1.2)
Total Protein: 6.7 g/dL (ref 6.0–8.3)

## 2019-08-24 LAB — CBC WITH DIFFERENTIAL/PLATELET
Basophils Absolute: 0.1 10*3/uL (ref 0.0–0.1)
Basophils Relative: 1.2 % (ref 0.0–3.0)
Eosinophils Absolute: 0.1 10*3/uL (ref 0.0–0.7)
Eosinophils Relative: 2.3 % (ref 0.0–5.0)
HCT: 39.7 % (ref 39.0–52.0)
Hemoglobin: 13.2 g/dL (ref 13.0–17.0)
Lymphocytes Relative: 27.5 % (ref 12.0–46.0)
Lymphs Abs: 1.4 10*3/uL (ref 0.7–4.0)
MCHC: 33.2 g/dL (ref 30.0–36.0)
MCV: 90.4 fl (ref 78.0–100.0)
Monocytes Absolute: 0.5 10*3/uL (ref 0.1–1.0)
Monocytes Relative: 9.2 % (ref 3.0–12.0)
Neutro Abs: 3 10*3/uL (ref 1.4–7.7)
Neutrophils Relative %: 59.8 % (ref 43.0–77.0)
Platelets: 180 10*3/uL (ref 150.0–400.0)
RBC: 4.39 Mil/uL (ref 4.22–5.81)
RDW: 13.7 % (ref 11.5–15.5)
WBC: 5.1 10*3/uL (ref 4.0–10.5)

## 2019-08-24 LAB — LIPID PANEL
Cholesterol: 197 mg/dL (ref 0–200)
HDL: 61.6 mg/dL (ref >39.00)
LDL Cholesterol: 120 mg/dL — ABNORMAL HIGH (ref 0–99)
NonHDL: 135.37
Total CHOL/HDL Ratio: 3
Triglycerides: 77 mg/dL (ref 0.0–149.0)
VLDL: 15.4 mg/dL (ref 0.0–40.0)

## 2019-08-24 LAB — TSH: TSH: 1.44 u[IU]/mL (ref 0.35–4.50)

## 2019-08-24 LAB — VITAMIN D 25 HYDROXY (VIT D DEFICIENCY, FRACTURES): VITD: 31.88 ng/mL (ref 30.00–100.00)

## 2019-08-24 LAB — SARS-COV-2 IGG: SARS-COV-2 IgG: 0.06

## 2019-08-24 LAB — HEMOGLOBIN A1C: Hgb A1c MFr Bld: 5.6 % (ref 4.6–6.5)

## 2019-08-24 LAB — PSA: PSA: 0.22 ng/mL (ref 0.10–4.00)

## 2019-08-25 LAB — URINALYSIS, ROUTINE W REFLEX MICROSCOPIC
Bilirubin Urine: NEGATIVE
Glucose, UA: NEGATIVE
Hgb urine dipstick: NEGATIVE
Ketones, ur: NEGATIVE
Leukocytes,Ua: NEGATIVE
Nitrite: NEGATIVE
Protein, ur: NEGATIVE
Specific Gravity, Urine: 1.004 (ref 1.001–1.03)
pH: 7 (ref 5.0–8.0)

## 2019-08-28 LAB — HEPATITIS B SURFACE ANTIBODY, QUANTITATIVE: Hep B S AB Quant (Post): 813 m[IU]/mL (ref >=10)

## 2019-12-11 IMAGING — DX DG CHEST 2V
2 series · 2 of 2 positions shown · non-contrast
Comparison: None in PACs

CLINICAL DATA: Two months of cough, former smoker.

EXAM:
CHEST  2 VIEW

[chest pa]
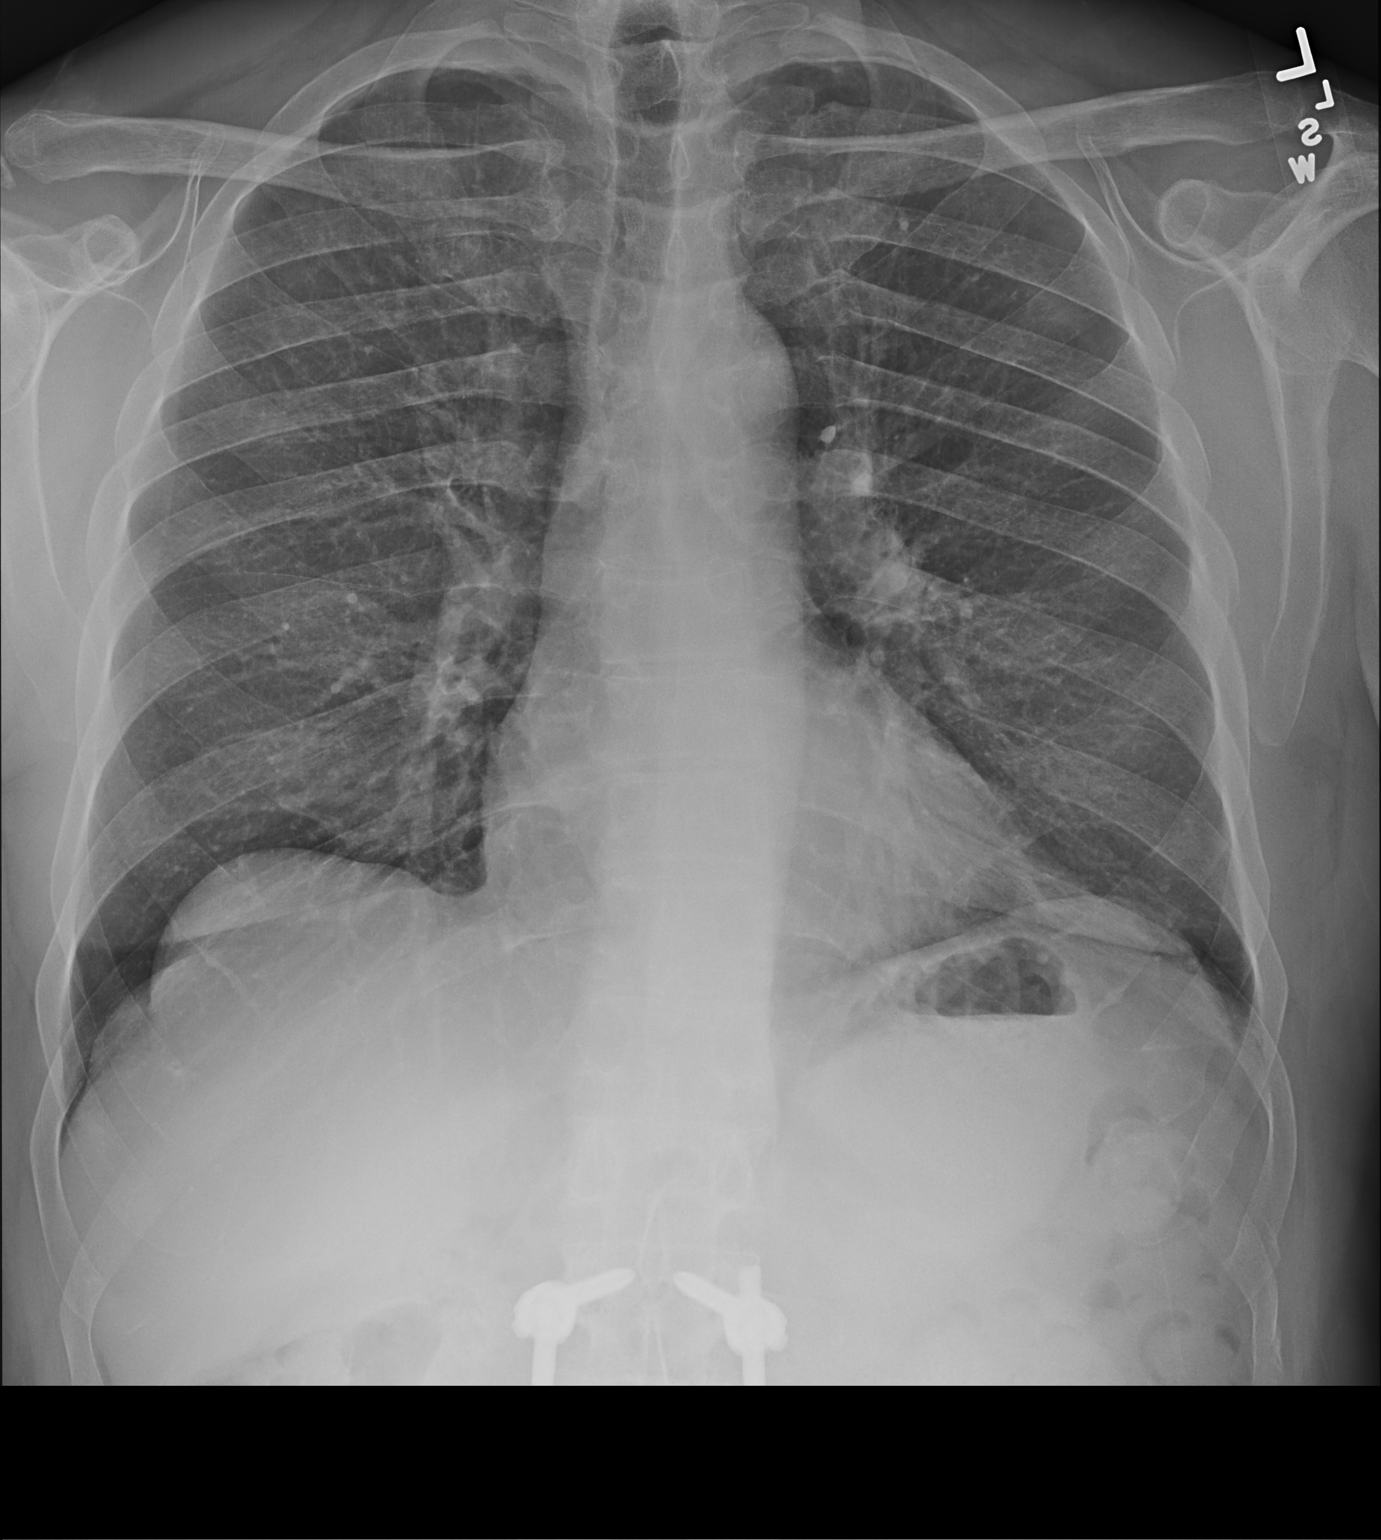

[chest lat]
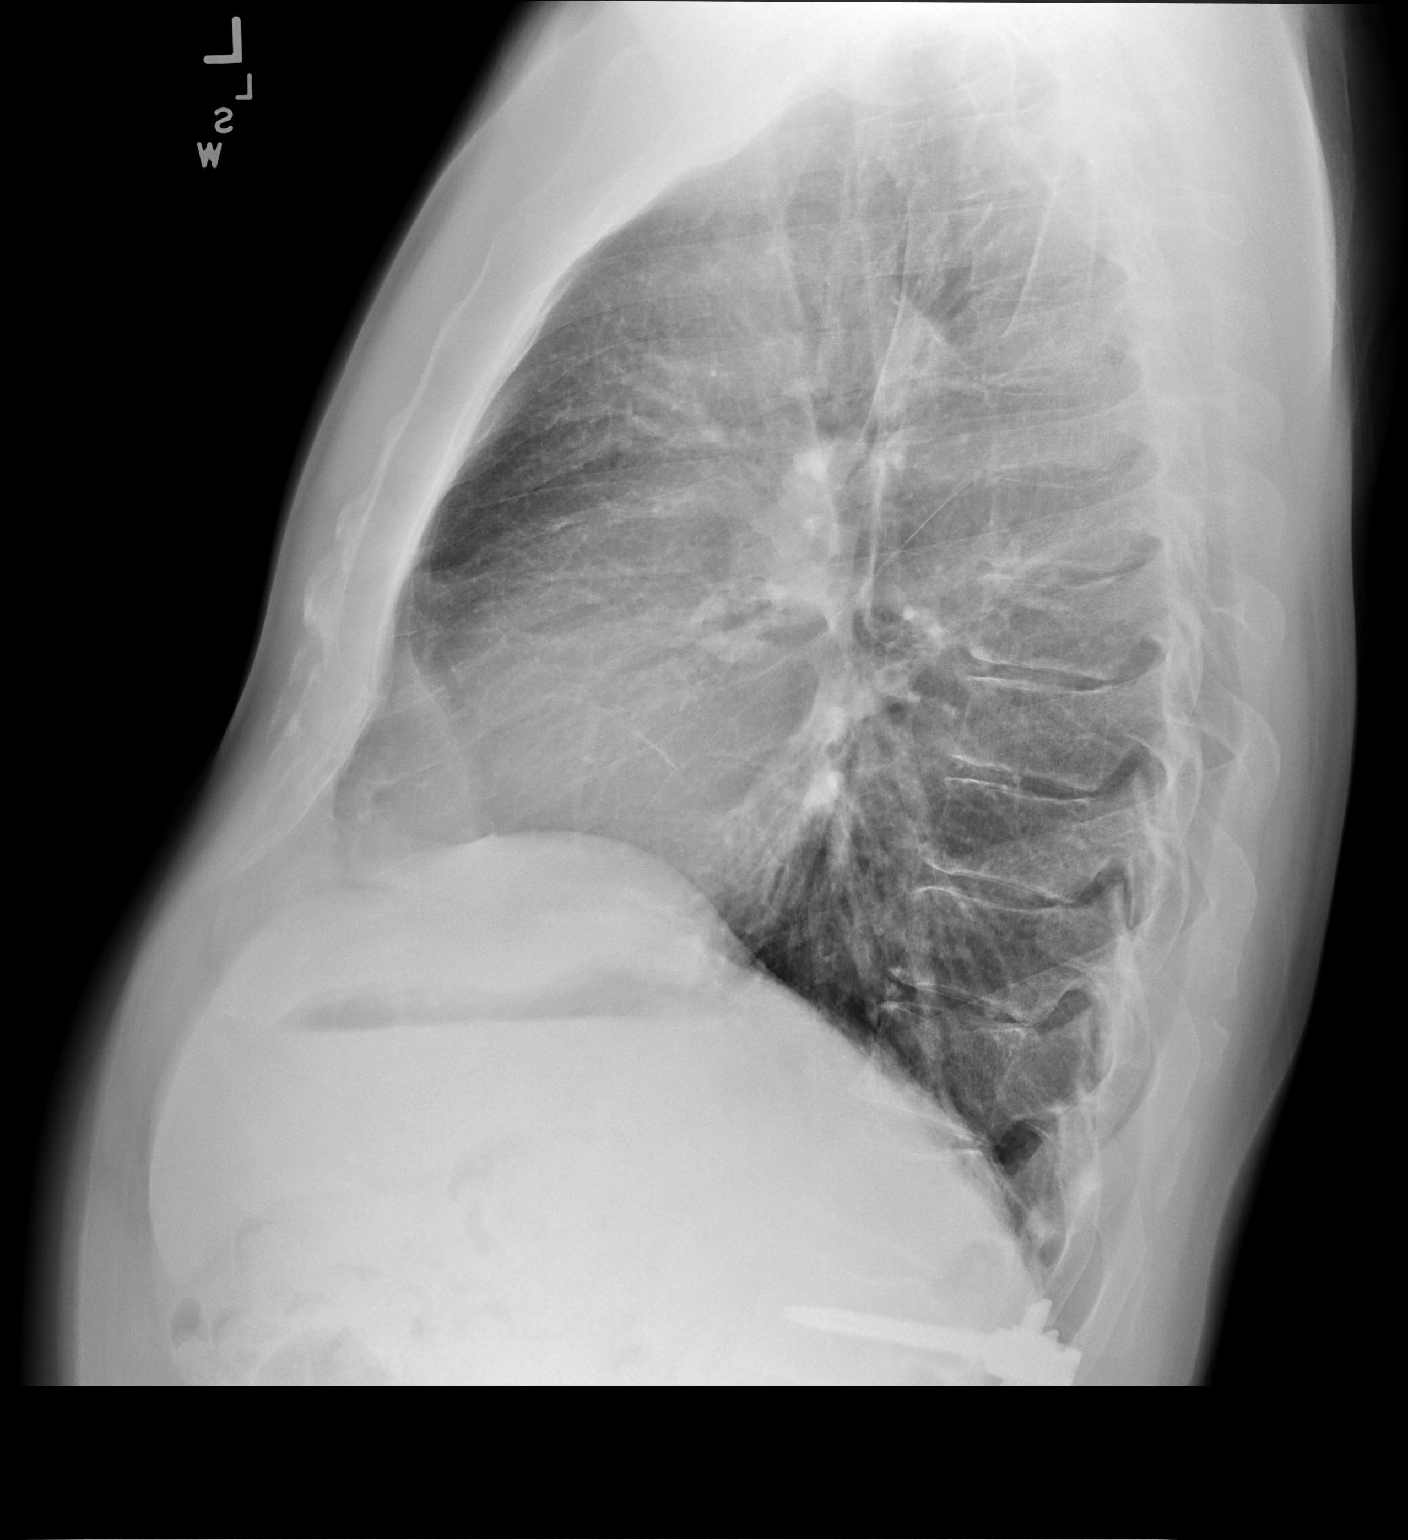

[2 of 2 positions shown; findings below may reference images not displayed]

FINDINGS: The lungs are adequately inflated. The interstitial markings are
coarse bilaterally. There is no alveolar infiltrate or pleural
effusion. The heart and pulmonary vascularity are normal. The
trachea is midline. There is deformity of the lateral aspect of the
left 6 rib which may be old. There is mild multilevel degenerative
disc disease of the thoracic spine.
IMPRESSION: Chronic bronchitic-smoking related interstitial changes. No alveolar
pneumonia nor CHF.

## 2020-03-17 ENCOUNTER — Encounter: Payer: Self-pay | Admitting: Internal Medicine

## 2020-03-17 ENCOUNTER — Ambulatory Visit: Payer: PRIVATE HEALTH INSURANCE

## 2020-03-17 ENCOUNTER — Telehealth: Payer: Self-pay | Admitting: Internal Medicine

## 2020-03-17 NOTE — Telephone Encounter (Signed)
Inform pt  Vitamin C 1000 mg daily  Quercetin 250-500 mg 2x per day  Zinc 100 mg daily  Vitamin D 4000 Iu daily  mucinex Dm green label   Out of work at home x 14 days from + covid test Sanitize all surfaces at home and do not sleep in bed with anyone of share dishes  If worsening call for appt here or seek help at UC/ED -sob, cough, fever, blue lips, chest pain, confusion/dizziness needs to go to ED

## 2020-03-17 NOTE — Telephone Encounter (Signed)
SARS-COV-2 RNA, QL, RT PCR (COVID-19)Resulted: 03/16/2020 11:20 PM [facility] Component Name Value Ref Range  SARS-CoV-2, NAA Positive (A)  Comment: Positive: SARS-CoV-2 detected Testing identified the presence of SARS-CoV-2 (the virus that causes COVID-19) in the patient's sample.  This test was developed for the detection of nucleic acids from the SARS-CoV-2 virus by RT-PCR in individuals who meet SARS-CoV-2 clinical and/or epidemiological criteria. This test has not been FDA cleared or approved. This test has been authorized by FDA under an EUA for use by the authorized laboratory. This test is only authorized for the duration of time that the Secretary of the HHS declares circumstances exist justifying the authorization of the emergency use of  in vitro diagnostic tests for detection of SARS-CoV-2 virus and/or diagnosis of COVID-19 infection under section 564(b)(1) of the Act, 21 U.S.C. 754HKG-6(V)(7), unless the authorization is terminated or revoked sooner. Specimens that are self-collected  were not tested with an internal control to confirm that the specimen was properly collected. As such, unsupervised self-collected specimens from SARS-CoV-2 positive individuals may yield negative results if the specimen was not collected properly. To learn more about this test, go to https://www.helix.com/pages/[email or web address]-efforts Performed byMorene Rankins, CAP [id number], CLIA [id number], [id number] [place] [address] Laboratory Director: Maurene Capes, PhD, [facility], [facility] Negative

## 2020-03-18 NOTE — Telephone Encounter (Signed)
Sent to patient via mychart encounter from 03/17/20.

## 2020-08-07 ENCOUNTER — Encounter: Payer: Self-pay | Admitting: Internal Medicine

## 2020-08-07 ENCOUNTER — Telehealth (INDEPENDENT_AMBULATORY_CARE_PROVIDER_SITE_OTHER): Payer: PRIVATE HEALTH INSURANCE | Admitting: Internal Medicine

## 2020-08-07 VITALS — Ht 70.0 in | Wt 190.0 lb

## 2020-08-07 DIAGNOSIS — K219 Gastro-esophageal reflux disease without esophagitis: Secondary | ICD-10-CM | POA: Diagnosis not present

## 2020-08-07 DIAGNOSIS — Z1329 Encounter for screening for other suspected endocrine disorder: Secondary | ICD-10-CM | POA: Diagnosis not present

## 2020-08-07 DIAGNOSIS — J309 Allergic rhinitis, unspecified: Secondary | ICD-10-CM

## 2020-08-07 DIAGNOSIS — Z13220 Encounter for screening for lipoid disorders: Secondary | ICD-10-CM | POA: Diagnosis not present

## 2020-08-07 DIAGNOSIS — Z0000 Encounter for general adult medical examination without abnormal findings: Secondary | ICD-10-CM

## 2020-08-07 DIAGNOSIS — Z125 Encounter for screening for malignant neoplasm of prostate: Secondary | ICD-10-CM

## 2020-08-07 DIAGNOSIS — Z1389 Encounter for screening for other disorder: Secondary | ICD-10-CM

## 2020-08-07 MED ORDER — PANTOPRAZOLE SODIUM 40 MG PO TBEC: 40.0000 mg | DELAYED_RELEASE_TABLET | ORAL | 3 refills | Status: DC

## 2020-08-07 MED ORDER — FLUTICASONE PROPIONATE 50 MCG/ACT NA SUSP: 1.0000 | Freq: Every day | NASAL | 4 refills | Status: DC
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
## 2020-08-07 NOTE — Patient Instructions (Addendum)
COVID-19 Vaccine Information can be found at: [email or web address] For questions related to vaccine distribution or appointments, please email [email or web address] or call [phone].   Food Choices for Gastroesophageal Reflux Disease, Adult When you have gastroesophageal reflux disease (GERD), the foods you eat and your eating habits are very important. Choosing the right foods can help ease the discomfort of GERD. Consider working with a diet and nutrition specialist (dietitian) to help you make healthy food choices. What general guidelines should I follow?  Eating plan  Choose healthy foods low in fat, such as fruits, vegetables, whole grains, low-fat dairy products, and lean meat, fish, and poultry.  Eat frequent, small meals instead of three large meals each day. Eat your meals slowly, in a relaxed setting. Avoid bending over or lying down until 2-3 hours after eating.  Limit high-fat foods such as fatty meats or fried foods.  Limit your intake of oils, butter, and shortening to less than 8 teaspoons each day.  Avoid the following: ? Foods that cause symptoms. These may be different for different people. Keep a food diary to keep track of foods that cause symptoms. ? Alcohol. ? Drinking large amounts of liquid with meals. ? Eating meals during the 2-3 hours before bed.  Cook foods using methods other than frying. This may include baking, grilling, or broiling. Lifestyle  Maintain a healthy weight. Ask your health care provider what weight is healthy for you. If you need to lose weight, work with your health care provider to do so safely.  Exercise for at least 30 minutes on 5 or more days each week, or as told by your health care provider.  Avoid wearing clothes that fit tightly around your waist and chest.  Do not use any products that contain nicotine or tobacco, such as cigarettes and e-cigarettes. If you need help  quitting, ask your health care provider.  Sleep with the head of your bed raised. Use a wedge under the mattress or blocks under the bed frame to raise the head of the bed. What foods are not recommended? The items listed may not be a complete list. Talk with your dietitian about what dietary choices are best for you. Grains Pastries or quick breads with added fat. [place] toast. Vegetables Deep fried vegetables. [place] fries. Any vegetables prepared with added fat. Any vegetables that cause symptoms. For some people this may include tomatoes and tomato products, chili peppers, onions and garlic, and horseradish. Fruits Any fruits prepared with added fat. Any fruits that cause symptoms. For some people this may include citrus fruits, such as oranges, grapefruit, pineapple, and lemons. Meats and other protein foods High-fat meats, such as fatty beef or pork, hot dogs, ribs, ham, sausage, salami and bacon. Fried meat or protein, including fried fish and fried chicken. Nuts and nut butters. Dairy Whole milk and chocolate milk. Sour cream. Cream. Ice cream. Cream cheese. Milk shakes. Beverages Coffee and tea, with or without caffeine. Carbonated beverages. Sodas. Energy drinks. Fruit juice made with acidic fruits (such as orange or grapefruit). Tomato juice. Alcoholic drinks. Fats and oils Butter. Margarine. Shortening. Ghee. Sweets and desserts Chocolate and cocoa. Donuts. Seasoning and other foods Pepper. Peppermint and spearmint. Any condiments, herbs, or seasonings that cause symptoms. For some people, this may include curry, hot sauce, or vinegar-based salad dressings. Summary  When you have gastroesophageal reflux disease (GERD), food and lifestyle choices are very important to help ease the discomfort of GERD.  Eat frequent, small meals instead of three large meals  each day. Eat your meals slowly, in a relaxed setting. Avoid bending over or lying down until 2-3 hours after  eating.  Limit high-fat foods such as fatty meat or fried foods. This information is not intended to replace advice given to you by your health care provider. Make sure you discuss any questions you have with your health care provider. Document Revised: 03/29/2019 Document Reviewed: 12/07/2016 Elsevier Patient Education  2020 Elsevier Inc.   Gastroesophageal Reflux Disease, Adult Gastroesophageal reflux (GER) happens when acid from the stomach flows up into the tube that connects the mouth and the stomach (esophagus). Normally, food travels down the esophagus and stays in the stomach to be digested. However, when a person has GER, food and stomach acid sometimes move back up into the esophagus. If this becomes a more serious problem, the person may be diagnosed with a disease called gastroesophageal reflux disease (GERD). GERD occurs when the reflux:  Happens often.  Causes frequent or severe symptoms.  Causes problems such as damage to the esophagus. When stomach acid comes in contact with the esophagus, the acid may cause soreness (inflammation) in the esophagus. Over time, GERD may create small holes (ulcers) in the lining of the esophagus. What are the causes? This condition is caused by a problem with the muscle between the esophagus and the stomach (lower esophageal sphincter, or LES). Normally, the LES muscle closes after food passes through the esophagus to the stomach. When the LES is weakened or abnormal, it does not close properly, and that allows food and stomach acid to go back up into the esophagus. The LES can be weakened by certain dietary substances, medicines, and medical conditions, including:  Tobacco use.  Pregnancy.  Having a hiatal hernia.  Alcohol use.  Certain foods and beverages, such as coffee, chocolate, onions, and peppermint. What increases the risk? You are more likely to develop this condition if you:  Have an increased body weight.  Have a  connective tissue disorder.  Use NSAID medicines. What are the signs or symptoms? Symptoms of this condition include:  Heartburn.  Difficult or painful swallowing.  The feeling of having a lump in the throat.  Abitter taste in the mouth.  Bad breath.  Having a large amount of saliva.  Having an upset or bloated stomach.  Belching.  Chest pain. Different conditions can cause chest pain. Make sure you see your health care provider if you experience chest pain.  Shortness of breath or wheezing.  Ongoing (chronic) cough or a night-time cough.  Wearing away of tooth enamel.  Weight loss. How is this diagnosed? Your health care provider will take a medical history and perform a physical exam. To determine if you have mild or severe GERD, your health care provider may also monitor how you respond to treatment. You may also have tests, including:  A test to examine your stomach and esophagus with a small camera (endoscopy).  A test thatmeasures the acidity level in your esophagus.  A test thatmeasures how much pressure is on your esophagus.  A barium swallow or modified barium swallow test to show the shape, size, and functioning of your esophagus. How is this treated? The goal of treatment is to help relieve your symptoms and to prevent complications. Treatment for this condition may vary depending on how severe your symptoms are. Your health care provider may recommend:  Changes to your diet.  Medicine.  Surgery. Follow these instructions at home: Eating and drinking   Follow a diet  as recommended by your health care provider. This may involve avoiding foods and drinks such as: ? Coffee and tea (with or without caffeine). ? Drinks that containalcohol. ? Energy drinks and sports drinks. ? Carbonated drinks or sodas. ? Chocolate and cocoa. ? Peppermint and mint flavorings. ? Garlic and onions. ? Horseradish. ? Spicy and acidic foods, including peppers, chili  powder, curry powder, vinegar, hot sauces, and barbecue sauce. ? Citrus fruit juices and citrus fruits, such as oranges, lemons, and limes. ? Tomato-based foods, such as red sauce, chili, salsa, and pizza with red sauce. ? Fried and fatty foods, such as donuts, french fries, potato chips, and high-fat dressings. ? High-fat meats, such as hot dogs and fatty cuts of red and white meats, such as rib eye steak, sausage, ham, and bacon. ? High-fat dairy items, such as whole milk, butter, and cream cheese.  Eat small, frequent meals instead of large meals.  Avoid drinking large amounts of liquid with your meals.  Avoid eating meals during the 2-3 hours before bedtime.  Avoid lying down right after you eat.  Do not exercise right after you eat. Lifestyle   Do not use any products that contain nicotine or tobacco, such as cigarettes, e-cigarettes, and chewing tobacco. If you need help quitting, ask your health care provider.  Try to reduce your stress by using methods such as yoga or meditation. If you need help reducing stress, ask your health care provider.  If you are overweight, reduce your weight to an amount that is healthy for you. Ask your health care provider for guidance about a safe weight loss goal. General instructions  Pay attention to any changes in your symptoms.  Take over-the-counter and prescription medicines only as told by your health care provider. Do not take aspirin, ibuprofen, or other NSAIDs unless your health care provider told you to do so.  Wear loose-fitting clothing. Do not wear anything tight around your waist that causes pressure on your abdomen.  Raise (elevate) the head of your bed about 6 inches (15 cm).  Avoid bending over if this makes your symptoms worse.  Keep all follow-up visits as told by your health care provider. This is important. Contact a health care provider if:  You have: ? New symptoms. ? Unexplained weight loss. ? Difficulty  swallowing or it hurts to swallow. ? Wheezing or a persistent cough. ? A hoarse voice.  Your symptoms do not improve with treatment. Get help right away if you:  Have pain in your arms, neck, jaw, teeth, or back.  Feel sweaty, dizzy, or light-headed.  Have chest pain or shortness of breath.  Vomit and your vomit looks like blood or coffee grounds.  Faint.  Have stool that is bloody or black.  Cannot swallow, drink, or eat. Summary  Gastroesophageal reflux happens when acid from the stomach flows up into the esophagus. GERD is a disease in which the reflux happens often, causes frequent or severe symptoms, or causes problems such as damage to the esophagus.  Treatment for this condition may vary depending on how severe your symptoms are. Your health care provider may recommend diet and lifestyle changes, medicine, or surgery.  Contact a health care provider if you have new or worsening symptoms.  Take over-the-counter and prescription medicines only as told by your health care provider. Do not take aspirin, ibuprofen, or other NSAIDs unless your health care provider told you to do so.  Keep all follow-up visits as told by your health  care provider. This is important. This information is not intended to replace advice given to you by your health care provider. Make sure you discuss any questions you have with your health care provider. Document Revised: 06/14/2018 Document Reviewed: 06/14/2018 Elsevier Patient Education  2020 [employer].

## 2020-08-12 ENCOUNTER — Ambulatory Visit: Payer: PRIVATE HEALTH INSURANCE | Admitting: Internal Medicine

## 2024-03-05 ENCOUNTER — Encounter: Payer: Self-pay | Admitting: Family Medicine

## 2024-03-05 ENCOUNTER — Ambulatory Visit: Payer: PRIVATE HEALTH INSURANCE | Admitting: Family Medicine

## 2024-03-05 VITALS — BP 133/68 | HR 63 | Temp 98.3°F | Ht 69.29 in | Wt 176.4 lb

## 2024-03-05 DIAGNOSIS — Z87891 Personal history of nicotine dependence: Secondary | ICD-10-CM

## 2024-03-05 DIAGNOSIS — R5383 Other fatigue: Secondary | ICD-10-CM

## 2024-03-05 DIAGNOSIS — Z122 Encounter for screening for malignant neoplasm of respiratory organs: Secondary | ICD-10-CM | POA: Diagnosis not present

## 2024-03-05 DIAGNOSIS — Z23 Encounter for immunization: Secondary | ICD-10-CM | POA: Diagnosis not present

## 2024-03-05 DIAGNOSIS — Z1212 Encounter for screening for malignant neoplasm of rectum: Secondary | ICD-10-CM

## 2024-03-05 DIAGNOSIS — Z0000 Encounter for general adult medical examination without abnormal findings: Secondary | ICD-10-CM | POA: Diagnosis not present

## 2024-03-05 DIAGNOSIS — Z7689 Persons encountering health services in other specified circumstances: Secondary | ICD-10-CM

## 2024-03-05 DIAGNOSIS — Z114 Encounter for screening for human immunodeficiency virus [HIV]: Secondary | ICD-10-CM

## 2024-03-05 DIAGNOSIS — E349 Endocrine disorder, unspecified: Secondary | ICD-10-CM | POA: Diagnosis not present

## 2024-03-05 DIAGNOSIS — Z1159 Encounter for screening for other viral diseases: Secondary | ICD-10-CM

## 2024-03-05 DIAGNOSIS — Z1211 Encounter for screening for malignant neoplasm of colon: Secondary | ICD-10-CM
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
## 2024-03-05 NOTE — Patient Instructions (Addendum)
 Come in a week before your physical to get fasting labs done   MyChart:  For all urgent or time sensitive needs we ask that you please call the office to avoid delays. Our number is [phone]. MyChart is not constantly monitored and due to the large volume of messages a day, replies may take up to 72 business hours.   MyChart Policy: MyChart allows for you to see your visit notes, after visit summary, provider recommendations, lab and tests results, make an appointment, request refills, and contact your provider or the office for non-urgent questions or concerns. Providers are seeing patients during normal business hours and do not have built in time to review MyChart messages.  We ask that you allow a minimum of 3 business days for responses to [employer]. For this reason, please do not send urgent requests through MyChart. Please call the office at [phone]. New and ongoing conditions may require a visit. We have virtual and in person visit available for your convenience.  Complex MyChart concerns may require a visit. Your provider may request you schedule a virtual or in person visit to ensure we are providing the best care possible. MyChart messages sent after 11:00 AM on Friday will not be received by the provider until Monday morning.    Lab and Test Results: You will receive your lab and test results on MyChart as soon as they are completed and results have been sent by the lab or testing facility. Due to this service, you will receive your results BEFORE your provider.  I review lab and tests results each morning prior to seeing patients. Some results require collaboration with other providers to ensure you are receiving the most appropriate care. For this reason, we ask that you please allow a minimum of 3-5 business days from the time the ALL results have been received for your provider to receive and review lab and test results and contact you about these.  Most lab and test  result comments from the provider will be sent through MyChart. Your provider may recommend changes to the plan of care, follow-up visits, repeat testing, ask questions, or request an office visit to discuss these results. You may reply directly to this message or call the office at [phone] to provide information for the provider or set up an appointment. In some instances, you will be called with test results and recommendations. Please let us know if this is preferred and we will make note of this in your chart to provide this for you.    If you have not heard a response to your lab or test results in 5 business days from all results returning to MyChart, please call the office to let us know. We ask that you please avoid calling prior to this time unless there is an emergent concern. Due to high call volumes, this can delay the resulting process.   After Hours: For all non-emergency after hours needs, please call the office at [phone] and select the option to reach the on-call provider service. On-call services are shared between multiple [facility] offices and therefore it will not be possible to speak directly with your provider. On-call providers may provide medical advice and recommendations, but are unable to provide refills for maintenance medications.  For all emergency or urgent medical needs after normal business hours, we recommend that you seek care at the closest Urgent Care or Emergency Department to ensure appropriate treatment in a timely manner.  [facility] has a 24 hour emergency room located on the ground floor for your convenience.    Urgent Concerns During the Business Day Providers are seeing patients from 8AM to 5PM, Monday through Thursday, and 8AM to 12PM on Friday with a busy schedule and are most often not able to respond to non-urgent calls until the end of the day or the next business day. If you should have URGENT concerns during the day,  please call and speak to the nurse or schedule a same day appointment so that we can address your concern without delay.    Thank you, again, for choosing me as your health care partner. I appreciate your trust and look forward to learning more about you.    Alyson Reedy, FNP-C

## 2024-03-14 ENCOUNTER — Telehealth: Payer: Self-pay

## 2024-03-14 ENCOUNTER — Other Ambulatory Visit: Payer: Self-pay

## 2024-03-14 DIAGNOSIS — Z1211 Encounter for screening for malignant neoplasm of colon: Secondary | ICD-10-CM

## 2024-03-14 MED ORDER — NA SULFATE-K SULFATE-MG SULF 17.5-3.13-1.6 GM/177ML PO SOLN: 1.0000 | Freq: Once | ORAL | 0 refills | Status: AC

## 2024-03-14 NOTE — Telephone Encounter (Signed)
 Gastroenterology Pre-Procedure Review  Request Date: 04/03/24 Requesting Physician: Dr. Servando Snare  PATIENT REVIEW QUESTIONS: The patient responded to the following health history questions as indicated:    1. Are you having any GI issues? no 2. Do you have a personal history of Polyps? no 3. Do you have a family history of Colon Cancer or Polyps? no 4. Diabetes Mellitus? no 5. Joint replacements in the past 12 months?no 6. Major health problems in the past 3 months?no 7. Any artificial heart valves, MVP, or defibrillator?no    MEDICATIONS & ALLERGIES:    Patient reports the following regarding taking any anticoagulation/antiplatelet therapy:   Plavix, Coumadin, Eliquis, Xarelto, Lovenox, Pradaxa, Brilinta, or Effient? no Aspirin? no  Patient confirms/reports the following medications:  Current Outpatient Medications  Medication Sig Dispense Refill   Na Sulfate-K Sulfate-Mg Sulfate concentrate (SUPREP) 17.5-3.13-1.6 GM/177ML SOLN Take 1 kit (354 mLs total) by mouth once for 1 dose. 354 mL 0   clobetasol ointment (TEMOVATE) 0.05 % Apply topically 2 (two) times daily.     Multiple Vitamin (MULTIVITAMIN) tablet Take 1 tablet by mouth daily.     No current facility-administered medications for this visit.    Patient confirms/reports the following allergies:  Allergies  Allergen Reactions   Other     No orders of the defined types were placed in this encounter.   AUTHORIZATION INFORMATION Primary Insurance: 1D#: Group #:  Secondary Insurance: 1D#: Group #:  SCHEDULE INFORMATION: Date: 04/03/24 Time: Location:[facility]

## 2024-04-03 ENCOUNTER — Ambulatory Visit: Admitting: Certified Registered"

## 2024-04-03 ENCOUNTER — Other Ambulatory Visit: Payer: Self-pay

## 2024-04-03 ENCOUNTER — Encounter: Admitting: Family Medicine

## 2024-04-03 ENCOUNTER — Ambulatory Visit
Admission: RE | Admit: 2024-04-03 | Discharge: 2024-04-03 | Disposition: A | Discharge status: Home / Self Care | Attending: Gastroenterology | Admitting: Gastroenterology

## 2024-04-03 ENCOUNTER — Encounter
Admission: RE | Disposition: A | Discharge status: Home / Self Care | Payer: Self-pay | Source: Home / Self Care | Attending: Gastroenterology

## 2024-04-03 ENCOUNTER — Encounter: Payer: Self-pay | Admitting: Gastroenterology

## 2024-04-03 DIAGNOSIS — K641 Second degree hemorrhoids: Secondary | ICD-10-CM | POA: Diagnosis not present

## 2024-04-03 DIAGNOSIS — Z87891 Personal history of nicotine dependence: Secondary | ICD-10-CM | POA: Diagnosis not present

## 2024-04-03 DIAGNOSIS — Z1211 Encounter for screening for malignant neoplasm of colon: Secondary | ICD-10-CM | POA: Diagnosis not present

## 2024-04-03 HISTORY — PX: COLONOSCOPY: SHX5424

## 2024-04-03 SURGERY — COLONOSCOPY
Anesthesia: General

## 2024-04-03 MED ORDER — PROPOFOL 500 MG/50ML IV EMUL
INTRAVENOUS | Status: DC | PRN
  Administered 2024-04-03: 150 ug/kg/min via INTRAVENOUS

## 2024-04-03 MED ORDER — PROPOFOL 1000 MG/100ML IV EMUL
INTRAVENOUS | Status: AC
  Filled 2024-04-03: qty 300

## 2024-04-03 MED ORDER — SODIUM CHLORIDE 0.9 % IV SOLN: INTRAVENOUS | Status: DC

## 2024-04-03 MED ORDER — PROPOFOL 10 MG/ML IV BOLUS
INTRAVENOUS | Status: DC | PRN
  Administered 2024-04-03: 30 mg via INTRAVENOUS
  Administered 2024-04-03: 20 mg via INTRAVENOUS

## 2024-04-03 NOTE — Anesthesia Postprocedure Evaluation (Signed)
 Anesthesia Post Note  Patient: Carlos Summers  Procedure(s) Performed: COLONOSCOPY  Patient location during evaluation: PACU Anesthesia Type: General Level of consciousness: awake and alert, oriented and patient cooperative Pain management: pain level controlled Vital Signs Assessment: post-procedure vital signs reviewed and stable Respiratory status: spontaneous breathing, nonlabored ventilation and respiratory function stable Cardiovascular status: blood pressure returned to baseline and stable Postop Assessment: adequate PO intake Anesthetic complications: no   No notable events documented.   Last Vitals:  Vitals:   04/03/24 0711 04/03/24 0802  BP: (!) 147/70 (!) 102/57  Pulse: (!) 52   Resp: 18   Temp: (!) 36.2 C (!) 36.1 C  SpO2: 100%     Last Pain:  Vitals:   04/03/24 0822  TempSrc:   PainSc: 0-No pain                 Dorothey Gate
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
## 2024-04-03 NOTE — Anesthesia Preprocedure Evaluation (Addendum)
 Anesthesia Evaluation  Patient identified by MRN, date of birth, ID band Patient awake    Reviewed: Allergy & Precautions, NPO status , Patient's Chart, lab work & pertinent test results  History of Anesthesia Complications Negative for: history of anesthetic complications  Airway Mallampati: I   Neck ROM: Full    Dental   [place] :   Pulmonary former smoker (quit 2009)   Pulmonary exam normal breath sounds clear to auscultation       Cardiovascular Exercise Tolerance: Good negative cardio ROS Normal cardiovascular exam Rhythm:Regular Rate:Normal     Neuro/Psych negative neurological ROS     GI/Hepatic ,GERD  ,,  Endo/Other  negative endocrine ROS    Renal/GU negative Renal ROS     Musculoskeletal   Abdominal   Peds  Hematology negative hematology ROS (+)   Anesthesia Other Findings   Reproductive/Obstetrics                             Anesthesia Physical Anesthesia Plan  ASA: 2  Anesthesia Plan: General   Post-op Pain Management:    Induction: Intravenous  PONV Risk Score and Plan: 2 and Propofol infusion, TIVA and Treatment may vary due to age or medical condition  Airway Management Planned: Natural Airway  Additional Equipment:   Intra-op Plan:   Post-operative Plan:   Informed Consent: I have reviewed the patients History and Physical, chart, labs and discussed the procedure including the risks, benefits and alternatives for the proposed anesthesia with the patient or authorized representative who has indicated his/her understanding and acceptance.       Plan Discussed with: CRNA  Anesthesia Plan Comments: (LMA/GETA backup discussed.  Patient consented for risks of anesthesia including but not limited to:  - adverse reactions to medications - damage to eyes, teeth, lips or other oral mucosa - nerve damage due to positioning  - sore throat or hoarseness -  damage to heart, brain, nerves, lungs, other parts of body or loss of life  Informed patient about role of CRNA in peri- and intra-operative care.  Patient voiced understanding.)       Anesthesia Quick Evaluation

## 2024-04-03 NOTE — Op Note (Signed)
 [facility] Gastroenterology Patient Name: Carlos Summers Procedure Date: 04/03/2024 7:36 AM MRN: [id number] Account #: [id number] Date of Birth: Aug 10, 1956 Admit Type: Outpatient Age: 68 Room: [facility] ENDO ROOM 4 Gender: Male Note Status: Finalized Instrument Name: Prentice Docker [id number] Procedure:             Colonoscopy Indications:           Screening for colorectal malignant neoplasm Providers:             Midge Minium MD, MD Referring MD:          Midge Minium MD, MD (Referring MD), Alyson Reedy                         (Referring MD) Medicines:             Propofol per Anesthesia Complications:         No immediate complications. Procedure:             Pre-Anesthesia Assessment:                        - Prior to the procedure, a History and Physical was                         performed, and patient medications and allergies were                         reviewed. The patient's tolerance of previous                         anesthesia was also reviewed. The risks and benefits                         of the procedure and the sedation options and risks                         were discussed with the patient. All questions were                         answered, and informed consent was obtained. Prior                         Anticoagulants: The patient has taken no anticoagulant                         or antiplatelet agents. ASA Grade Assessment: II - A                         patient with mild systemic disease. After reviewing                         the risks and benefits, the patient was deemed in                         satisfactory condition to undergo the procedure.                        After obtaining informed consent, the colonoscope was  passed under direct vision. Throughout the procedure,                         the patient's blood pressure, pulse, and oxygen                         saturations were monitored continuously. The                          Colonoscope was introduced through the anus and                         advanced to the the cecum, identified by appendiceal                         orifice and ileocecal valve. The colonoscopy was                         performed without difficulty. The patient tolerated                         the procedure well. The quality of the bowel                         preparation was excellent. Findings:      The perianal and digital rectal examinations were normal.      Non-bleeding internal hemorrhoids were found during retroflexion. The       hemorrhoids were Grade II (internal hemorrhoids that prolapse but reduce       spontaneously).      The exam was otherwise without abnormality. Impression:            - Non-bleeding internal hemorrhoids.                        - The examination was otherwise normal.                        - No specimens collected. Recommendation:        - Discharge patient to home.                        - Resume previous diet.                        - Continue present medications.                        - Repeat colonoscopy in 10 years for screening                         purposes. Procedure Code(s):     --- Professional ---                        [id number], Colonoscopy, flexible; diagnostic, including                         collection of specimen(s) by brushing or washing, when  performed (separate procedure) Diagnosis Code(s):     --- Professional ---                        Z12.11, Encounter for screening for malignant neoplasm                         of colon CPT copyright 2022 American Medical Association. All rights reserved. The codes documented in this report are preliminary and upon coder review may  be revised to meet current compliance requirements. Marnee Sink MD, MD 04/03/2024 8:06:56 AM This report has been signed electronically. Number of Addenda: 0 Note Initiated On: 04/03/2024 7:36 AM Scope Withdrawal Time:  0 hours 6 minutes 29 seconds  Total Procedure Duration: 0 hours 12 minutes 3 seconds  Estimated Blood Loss:  Estimated blood loss: none.      [facility]

## 2024-04-03 NOTE — Transfer of Care (Signed)
 Immediate Anesthesia Transfer of Care Note  Patient: Carlos Summers  Procedure(s) Performed: COLONOSCOPY  Patient Location: PACU  Anesthesia Type:General  Level of Consciousness: drowsy  Airway & Oxygen Therapy: Patient Spontanous Breathing and Patient connected to face mask oxygen  Post-op Assessment: Report given to RN, Post -op Vital signs reviewed and stable, and Patient moving all extremities X 4  Post vital signs: Reviewed and stable  Last Vitals:  Vitals Value Taken Time  BP 102/57   Temp    Pulse 55 04/03/24 0802  Resp 18 04/03/24 0802  SpO2 100 % 04/03/24 0802  Vitals shown include unfiled device data.  Last Pain:  Vitals:   04/03/24 0711  TempSrc: Temporal  PainSc: 0-No pain         Complications: No notable events documented.

## 2024-04-03 NOTE — Anesthesia Procedure Notes (Signed)
 Procedure Name: MAC Date/Time: 04/03/2024 7:43 AM  Performed by: Ulanda Gambles, CRNAPre-anesthesia Checklist: Patient identified, Emergency Drugs available, Suction available and Patient being monitored Oxygen Delivery Method: Nasal cannula

## 2024-04-04 ENCOUNTER — Ambulatory Visit (INDEPENDENT_AMBULATORY_CARE_PROVIDER_SITE_OTHER): Admitting: Family Medicine

## 2024-04-04 ENCOUNTER — Encounter: Payer: Self-pay | Admitting: Family Medicine

## 2024-04-04 VITALS — BP 125/79 | HR 54 | Ht 69.0 in | Wt 170.6 lb

## 2024-04-04 DIAGNOSIS — Z0000 Encounter for general adult medical examination without abnormal findings: Secondary | ICD-10-CM

## 2024-04-04 DIAGNOSIS — Z23 Encounter for immunization: Secondary | ICD-10-CM

## 2024-04-04 DIAGNOSIS — E291 Testicular hypofunction: Secondary | ICD-10-CM | POA: Diagnosis not present
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
## 2024-04-04 NOTE — Patient Instructions (Addendum)
 PATIENT COUNSELING: - Encouraged to adjust caloric intake to maintain or achieve ideal body weight, to reduce intake of dietary saturated fat and total fat, to limit sodium intake by avoiding high sodium foods and not adding table salt, and to maintain adequate dietary potassium and calcium preferably from fresh fruits, vegetables, and low-fat dairy products.   - Advised to avoid cigarette smoking. - Discussed with the patient that most people either abstain from alcohol or drink within safe limits (<=14/week and <=4 drinks/occasion for males, <=7/weeks and <= 3 drinks/occasion for females) and that the risk for alcohol disorders and other health effects rises proportionally with the number of drinks per week and how often a drinker exceeds daily limits. - Discussed cessation/primary prevention of drug use and availability of treatment for abuse.  - Stressed the importance of regular exercise - Injury prevention: Discussed safety belts, safety helmets, smoke detector, smoking near bedding or upholstery.  - Dental health: Discussed importance of regular tooth brushing, flossing, and dental visits.  - Sexuality: Discussed sexually transmitted diseases, partner selection, use of condoms, avoidance of unintended pregnancy  and contraceptive alternatives.  - The natural history of prostate cancer and ongoing controversy regarding screening and potential treatment outcomes of prostate cancer has been discussed with the patient. The meaning of a false positive PSA and a false negative PSA has been discussed. He indicates understanding of the limitations of this screening test and wishes not to proceed with screening PSA testing.   Health Maintenance Recommendations Screening Testing Mammogram Every 1 -2 years based on history and risk factors Starting at age 29 Pap Smear Ages 21-39 every 3 years Ages 51-65 every 5 years with HPV testing More frequent testing may be required based on results and  history Colon Cancer Screening Every 1-10 years based on test performed, risk factors, and history Starting at age 40 Bone Density Screening Every 2-10 years based on history Starting at age 37 for women Recommendations for men differ based on medication usage, history, and risk factors AAA Screening One time ultrasound Men 23-52 years old who have every smoked Lung Cancer Screening Low Dose Lung CT every 12 months Age 43-80 years with a 30 pack-year smoking history who still smoke or who have quit within the last 15 years   Screening Labs Routine  Labs: Complete Blood Count (CBC), Complete Metabolic Panel (CMP), Cholesterol (Lipid Panel) Every 6-12 months based on history and medications May be recommended more frequently based on current conditions or previous results Hemoglobin A1c Lab Every 3-12 months based on history and previous results Starting at age 75 or earlier with diagnosis of diabetes, high cholesterol, BMI >26, and/or risk factors Frequent monitoring for patients with diabetes to ensure blood sugar control Thyroid Panel (TSH w/ T3 & T4) Every 6 months based on history, symptoms, and risk factors May be repeated more often if on medication HIV One time testing for all patients 66 and older May be repeated more frequently for patients with increased risk factors or exposure Hepatitis C One time testing for all patients 58 and older May be repeated more frequently for patients with increased risk factors or exposure Gonorrhea, Chlamydia Every 12 months for all sexually active persons 13-24 years Additional monitoring may be recommended for those who are considered high risk or who have symptoms PSA Men 23-63 years old with risk factors Additional screening may be recommended from age 73-69 based on risk factors, symptoms, and history   Vaccine Recommendations Tetanus Booster All adults every  10 years Flu Vaccine All patients 6 months and older every year COVID  Vaccine All patients 12 years and older Initial dosing with booster May recommend additional booster based on age and health history HPV Vaccine 2 doses all patients age 19-26 Dosing may be considered for patients over 26 Shingles Vaccine (Shingrix) 2 doses all adults 55 years and older Pneumonia (Pneumovax 23) All adults 65 years and older May recommend earlier dosing based on health history Pneumonia (Prevnar 13) All adults 65 years and older Dosed 1 year after Pneumovax 23   Additional Screening, Testing, and Vaccinations may be recommended on an individualized basis based on family history, health history, risk factors, and/or exposure.  __________________________________________________________   Diet Recommendations for All Patients   I recommend that all patients maintain a diet low in saturated fats, carbohydrates, and cholesterol. While this can be challenging at first, it is not impossible and small changes can make big differences.  Things to try: Decreasing the amount of soda, sweet tea, and/or juice to one or less per day and replace with water While water is always the first choice, if you do not like water you may consider adding a water additive without sugar to improve the taste other sugar free drinks Replace potatoes with a brightly colored vegetable at dinner Use healthy oils, such as canola oil or olive oil, instead of butter or hard margarine Limit your bread intake to two pieces or less a day Replace regular pasta with low carb pasta options Bake, broil, or grill foods instead of frying Monitor portion sizes  Eat smaller, more frequent meals throughout the day instead of large meals   An important thing to remember is, if you love foods that are not great for your health, you don't have to give them up completely. Instead, allow these foods to be a reward when you have done well. Allowing yourself to still have special treats every once in a while is a nice  way to tell yourself thank you for working hard to keep yourself healthy.    Also remember that every day is a new day. If you have a bad day and "fall off the wagon", you can still climb right back up and keep moving along on your journey!   We have resources available to help you!  Some websites that may be helpful include: www.http://www.wall-moore.info/        Www.VeryWellFit.com _____________________________________________________________   Activity Recommendations for All Patients   I recommend that all adults get at least 20 minutes of moderate physical activity that elevates your heart rate at least 5 days out of the week.  Some examples include: Walking or jogging at a pace that allows you to carry on a conversation Cycling (stationary bike or outdoors) Water aerobics Yoga Weight lifting Dancing If physical limitations prevent you from putting stress on your joints, exercise in a pool or seated in a chair are excellent options.   Do determine your MAXIMUM heart rate for activity: YOUR AGE - 220 = MAX HeartRate    Remember! Do not push yourself too hard.  Start slowly and build up your pace, speed, weight, time in exercise, etc.  Allow your body to rest between exercise and get good sleep. You will need more water than normal when you are exerting yourself. Do not wait until you are thirsty to drink. Drink with a purpose of getting in at least 8, 8 ounce glasses of water a day plus more depending on how  much you exercise and sweat.      If you begin to develop dizziness, chest pain, abdominal pain, jaw pain, shortness of breath, headache, vision changes, lightheadedness, or other concerning symptoms, stop the activity and allow your body to rest. If your symptoms are severe, seek emergency evaluation immediately. If your symptoms are concerning, but not severe, please let us  know so that we can recommend further evaluation.

## 2024-04-06 ENCOUNTER — Encounter: Payer: Self-pay | Admitting: Family Medicine

## 2024-04-06 LAB — CBC WITH DIFFERENTIAL/PLATELET
Basophils Absolute: 0 10*3/uL (ref 0.0–0.2)
Basos: 1 %
EOS (ABSOLUTE): 0.1 10*3/uL (ref 0.0–0.4)
Eos: 1 %
Hematocrit: 43.1 % (ref 37.5–51.0)
Hemoglobin: 14.3 g/dL (ref 13.0–17.7)
Immature Grans (Abs): 0 10*3/uL (ref 0.0–0.1)
Immature Granulocytes: 0 %
Lymphocytes Absolute: 1.8 10*3/uL (ref 0.7–3.1)
Lymphs: 29 %
MCH: 31.2 pg (ref 26.6–33.0)
MCHC: 33.2 g/dL (ref 31.5–35.7)
MCV: 94 fL (ref 79–97)
Monocytes Absolute: 0.6 10*3/uL (ref 0.1–0.9)
Monocytes: 10 %
Neutrophils Absolute: 3.6 10*3/uL (ref 1.4–7.0)
Neutrophils: 59 %
Platelets: 241 10*3/uL (ref 150–450)
RBC: 4.59 x10E6/uL (ref 4.14–5.80)
RDW: 11.7 % (ref 11.6–15.4)
WBC: 6.1 10*3/uL (ref 3.4–10.8)

## 2024-04-06 LAB — COMPREHENSIVE METABOLIC PANEL WITH GFR
ALT: 21 IU/L (ref 0–44)
AST: 23 IU/L (ref 0–40)
Albumin: 4.6 g/dL (ref 3.9–4.9)
Alkaline Phosphatase: 56 IU/L (ref 44–121)
BUN/Creatinine Ratio: 13 (ref 10–24)
BUN: 14 mg/dL (ref 8–27)
Bilirubin Total: 0.5 mg/dL (ref 0.0–1.2)
CO2: 23 mmol/L (ref 20–29)
Calcium: 9.2 mg/dL (ref 8.6–10.2)
Chloride: 99 mmol/L (ref 96–106)
Creatinine, Ser: 1.1 mg/dL (ref 0.76–1.27)
Globulin, Total: 2.2 g/dL (ref 1.5–4.5)
Glucose: 91 mg/dL (ref 70–99)
Potassium: 4.7 mmol/L (ref 3.5–5.2)
Sodium: 138 mmol/L (ref 134–144)
Total Protein: 6.8 g/dL (ref 6.0–8.5)
eGFR: 74 mL/min/{1.73_m2} (ref >59)

## 2024-04-06 LAB — LIPID PANEL
Chol/HDL Ratio: 2.5 ratio (ref 0.0–5.0)
Cholesterol, Total: 190 mg/dL (ref 100–199)
HDL: 77 mg/dL (ref >39)
LDL Chol Calc (NIH): 104 mg/dL — ABNORMAL HIGH (ref 0–99)
Triglycerides: 46 mg/dL (ref 0–149)
VLDL Cholesterol Cal: 9 mg/dL (ref 5–40)

## 2024-04-06 LAB — TESTOSTERONE,FREE AND TOTAL
Testosterone, Free: 37.3 pg/mL — ABNORMAL HIGH (ref 6.6–18.1)
Testosterone: 446 ng/dL (ref 264–916)

## 2024-04-06 LAB — PSA: Prostate Specific Ag, Serum: 0.2 ng/mL (ref 0.0–4.0)

## 2024-04-06 LAB — TSH RFX ON ABNORMAL TO FREE T4: TSH: 0.883 u[IU]/mL (ref 0.450–4.500)

## 2024-04-06 LAB — HEMOGLOBIN A1C
Est. average glucose Bld gHb Est-mCnc: 108 mg/dL
Hgb A1c MFr Bld: 5.4 % (ref 4.8–5.6)

## 2024-05-16 DIAGNOSIS — R5383 Other fatigue: Secondary | ICD-10-CM | POA: Diagnosis not present

## 2024-05-16 DIAGNOSIS — E291 Testicular hypofunction: Secondary | ICD-10-CM | POA: Diagnosis not present

## 2024-05-16 DIAGNOSIS — Z79890 Hormone replacement therapy (postmenopausal): Secondary | ICD-10-CM | POA: Diagnosis not present

## 2024-06-27 DIAGNOSIS — E291 Testicular hypofunction: Secondary | ICD-10-CM | POA: Diagnosis not present

## 2024-06-29 DIAGNOSIS — M2550 Pain in unspecified joint: Secondary | ICD-10-CM | POA: Diagnosis not present

## 2025-04-05 ENCOUNTER — Encounter: Admitting: Family Medicine
